# Patient Record
Sex: Female | Born: 1975
Health system: Southern US, Community
[De-identification: ages and names within clinical notes are randomized; demographics above are authoritative.]

---

## 2006-06-21 ENCOUNTER — Ambulatory Visit (HOSPITAL_COMMUNITY): Admission: RE | Admit: 2006-06-21 | Discharge: 2006-06-21 | Payer: Self-pay

## 2006-10-16 ENCOUNTER — Ambulatory Visit (HOSPITAL_COMMUNITY): Admission: RE | Admit: 2006-10-16 | Discharge: 2006-10-16 | Payer: Self-pay

## 2006-11-27 ENCOUNTER — Ambulatory Visit (HOSPITAL_COMMUNITY): Admission: RE | Admit: 2006-11-27 | Discharge: 2006-11-27 | Payer: Self-pay | Admitting: Orthopaedic Surgery

## 2007-10-01 ENCOUNTER — Emergency Department (HOSPITAL_COMMUNITY): Admission: EM | Admit: 2007-10-01 | Discharge: 2007-10-01 | Payer: Self-pay | Admitting: Family Medicine

## 2007-10-31 ENCOUNTER — Encounter (INDEPENDENT_AMBULATORY_CARE_PROVIDER_SITE_OTHER): Payer: Self-pay | Admitting: Internal Medicine

## 2007-10-31 ENCOUNTER — Ambulatory Visit: Payer: Self-pay | Admitting: *Deleted

## 2007-10-31 DIAGNOSIS — L501 Idiopathic urticaria: Secondary | ICD-10-CM | POA: Insufficient documentation

## 2007-10-31 DIAGNOSIS — N750 Cyst of Bartholin's gland: Secondary | ICD-10-CM | POA: Insufficient documentation

## 2008-03-24 ENCOUNTER — Ambulatory Visit: Payer: Self-pay | Admitting: Gynecology

## 2008-09-23 ENCOUNTER — Ambulatory Visit: Payer: Self-pay | Admitting: Obstetrics and Gynecology

## 2008-10-07 ENCOUNTER — Ambulatory Visit: Payer: Self-pay | Admitting: Obstetrics & Gynecology

## 2008-10-28 ENCOUNTER — Ambulatory Visit: Payer: Self-pay | Admitting: Obstetrics and Gynecology

## 2008-10-28 ENCOUNTER — Encounter: Payer: Self-pay | Admitting: Obstetrics and Gynecology

## 2008-11-30 ENCOUNTER — Encounter (INDEPENDENT_AMBULATORY_CARE_PROVIDER_SITE_OTHER): Payer: Self-pay | Admitting: Internal Medicine

## 2008-12-07 ENCOUNTER — Ambulatory Visit (HOSPITAL_COMMUNITY): Admission: RE | Admit: 2008-12-07 | Discharge: 2008-12-07 | Payer: Self-pay | Admitting: Internal Medicine

## 2008-12-07 ENCOUNTER — Ambulatory Visit: Payer: Self-pay | Admitting: Internal Medicine

## 2008-12-07 ENCOUNTER — Encounter (INDEPENDENT_AMBULATORY_CARE_PROVIDER_SITE_OTHER): Payer: Self-pay | Admitting: Internal Medicine

## 2008-12-07 DIAGNOSIS — R42 Dizziness and giddiness: Secondary | ICD-10-CM

## 2008-12-07 LAB — CONVERTED CEMR LAB
Alkaline Phosphatase: 47 units/L (ref 39–117)
BUN: 8 mg/dL (ref 6–23)
Basophils Relative: 0 % (ref 0–1)
Creatinine, Ser: 0.69 mg/dL (ref 0.40–1.20)
Eosinophils Absolute: 0.1 10*3/uL (ref 0.0–0.7)
Glucose, Bld: 76 mg/dL (ref 70–99)
MCHC: 32.2 g/dL (ref 30.0–36.0)
MCV: 91.1 fL (ref 78.0–100.0)
Monocytes Absolute: 0.3 10*3/uL (ref 0.1–1.0)
Monocytes Relative: 6 % (ref 3–12)
Neutrophils Relative %: 62 % (ref 43–77)
RBC: 4.06 M/uL (ref 3.87–5.11)
Sodium: 140 meq/L (ref 135–145)
Total Bilirubin: 0.5 mg/dL (ref 0.3–1.2)
Vitamin B-12: 430 pg/mL (ref 211–911)

## 2008-12-14 ENCOUNTER — Ambulatory Visit: Payer: Self-pay | Admitting: Internal Medicine

## 2008-12-14 DIAGNOSIS — R51 Headache: Secondary | ICD-10-CM

## 2008-12-14 DIAGNOSIS — R519 Headache, unspecified: Secondary | ICD-10-CM | POA: Insufficient documentation

## 2008-12-14 DIAGNOSIS — J329 Chronic sinusitis, unspecified: Secondary | ICD-10-CM | POA: Insufficient documentation

## 2008-12-14 LAB — CONVERTED CEMR LAB: Beta hcg, urine, semiquantitative: NEGATIVE

## 2009-03-15 ENCOUNTER — Inpatient Hospital Stay (HOSPITAL_COMMUNITY): Admission: AD | Admit: 2009-03-15 | Discharge: 2009-03-15 | Payer: Self-pay | Admitting: Obstetrics & Gynecology

## 2009-03-15 ENCOUNTER — Ambulatory Visit: Payer: Self-pay | Admitting: Physician Assistant

## 2009-03-17 ENCOUNTER — Ambulatory Visit: Payer: Self-pay | Admitting: Internal Medicine

## 2009-09-05 ENCOUNTER — Emergency Department (HOSPITAL_COMMUNITY): Admission: EM | Admit: 2009-09-05 | Discharge: 2009-09-05 | Payer: Self-pay | Admitting: Family Medicine

## 2009-12-24 HISTORY — PX: WISDOM TOOTH EXTRACTION: SHX21

## 2010-03-24 HISTORY — PX: BARTHOLIN CYST MARSUPIALIZATION: SHX5383

## 2010-03-29 ENCOUNTER — Ambulatory Visit: Payer: Self-pay | Admitting: Obstetrics & Gynecology

## 2010-03-31 ENCOUNTER — Ambulatory Visit (HOSPITAL_COMMUNITY): Admission: RE | Admit: 2010-03-31 | Discharge: 2010-03-31 | Payer: Self-pay | Admitting: *Deleted

## 2010-03-31 ENCOUNTER — Ambulatory Visit: Payer: Self-pay | Admitting: Obstetrics & Gynecology

## 2010-05-10 ENCOUNTER — Ambulatory Visit: Payer: Self-pay | Admitting: Obstetrics and Gynecology

## 2010-11-14 ENCOUNTER — Ambulatory Visit: Payer: Self-pay | Admitting: Internal Medicine

## 2010-11-14 DIAGNOSIS — L218 Other seborrheic dermatitis: Secondary | ICD-10-CM | POA: Insufficient documentation

## 2010-11-14 DIAGNOSIS — L851 Acquired keratosis [keratoderma] palmaris et plantaris: Secondary | ICD-10-CM

## 2010-11-14 LAB — CONVERTED CEMR LAB
BUN: 11 mg/dL (ref 6–23)
CO2: 26 meq/L (ref 19–32)
Chloride: 103 meq/L (ref 96–112)
Creatinine, Ser: 0.71 mg/dL (ref 0.40–1.20)
HDL: 61 mg/dL (ref >39)
Hemoglobin: 12 g/dL (ref 12.0–15.0)
LDL Cholesterol: 100 mg/dL — ABNORMAL HIGH (ref 0–99)
Lymphocytes Relative: 29 % (ref 12–46)
Monocytes Absolute: 0.5 10*3/uL (ref 0.1–1.0)
Monocytes Relative: 8 % (ref 3–12)
Neutro Abs: 4 10*3/uL (ref 1.7–7.7)
Pap Smear: NEGATIVE
Potassium: 4.5 meq/L (ref 3.5–5.3)
RBC: 4.05 M/uL (ref 3.87–5.11)
VLDL: 12 mg/dL (ref 0–40)
WBC: 6.3 10*3/uL (ref 4.0–10.5)

## 2010-12-06 ENCOUNTER — Emergency Department (HOSPITAL_COMMUNITY)
Admission: EM | Admit: 2010-12-06 | Discharge: 2010-12-06 | Payer: Self-pay | Source: Home / Self Care | Admitting: Family Medicine

## 2010-12-20 ENCOUNTER — Telehealth: Payer: Self-pay | Admitting: Internal Medicine

## 2010-12-20 ENCOUNTER — Emergency Department (HOSPITAL_COMMUNITY)
Admission: EM | Admit: 2010-12-20 | Discharge: 2010-12-20 | Payer: Self-pay | Source: Home / Self Care | Admitting: Emergency Medicine

## 2011-01-23 NOTE — Assessment & Plan Note (Signed)
Summary: SKIN IS PEELING / SB.   Vital Signs:  Patient profile:   Jaime [name] year old Jaime [name] Height:      59.5 inches (151.13 cm) Weight:      118.05 pounds (Jaime [name].66 kg) BMI:     23.Jaime [name] Temp:     97.5 degrees F (36.39 degrees C) oral Pulse rate:   67 / minute BP sitting:   98 / 62  (right arm) Cuff size:   regular  Vitals Entered By: Angelina Ok RN (November 14, 2010 10:59 AM) CC: Depression Is Patient Diabetic? No Pain Assessment Patient in pain? no      Nutritional Status BMI of 19 -24 = normal  Have you ever been in a relationship where you felt threatened, hurt or afraid?No   Does patient need assistance? Functional Status Self care Ambulation Normal Comments Wants a check up. Wants blood work.  Peeling on her feet with burning and itching.  Went to Urgent Care a year ago.   CC:  Depression.  History of Present Illness: Jaime [name] y/o woman with no significant PMH comes to the clinic today after 1 year for the following reasons.  1.She reports that her feet skin has been peeling off for almost 1 year now. This has been associated with itching and sometimes calus formation as well. 2. She reports dryness and itching in her scalp for past few months. 3. She wants to get a pap smear and  routine labs.  Depression History:      The patient denies a depressed mood most of the day and a diminished interest in her usual daily activities.         Preventive Screening-Counseling & Management  Alcohol-Tobacco     Alcohol drinks/day: <1     Smoking Status: never     Passive Smoke Exposure: no  Current Medications (verified): 1)  Carmol 10 % Sham (Urea) .... Apply Twice Daily As Directed 2)  Laclotion 12 % Lotn (Ammonium Lactate) .... Apply Twice Daily As Directed  Allergies (verified): No Known Drug Allergies  Review of Systems      See HPI  The patient denies anorexia, fever, hoarseness, chest pain, syncope, dyspnea on exertion, peripheral edema, prolonged cough, headaches, and  abdominal pain.    Physical Exam  General:  alert, well-developed, and well-nourished.   Head:  dandruff in her hairs, normocephalic and atraumatic.   Mouth:  pharynx pink and moist.   Neck:  supple and full ROM.   Lungs:  normal respiratory effort, no intercostal retractions, no accessory muscle use, normal breath sounds, no dullness, no fremitus, no crackles, and no wheezes.   Heart:  normal rate, regular rhythm, no murmur, no gallop, no rub, and no JVD.   Abdomen:  soft, non-tender, normal bowel sounds, no distention, no masses, no guarding, no rigidity, and no rebound tenderness.   Msk:  normal ROM, no joint tenderness, no joint swelling, no joint warmth, and no redness over joints.   Pulses:  2+pulses b/l. Extremities:  skin peeling off in the vental aspect of her feet,no redness, no blisters, nails are well trimmed and healthy, no cyanosis, clubbing or edema. Neurologic:  alert & oriented X3, cranial nerves II-XII intact, strength normal in all extremities, sensation intact to light touch, gait normal, and DTRs symmetrical and normal.     Impression & Recommendations:  Problem # 1:  DANDRUFF (ICD-690.18) Assessment New She complains of itching and dryness is her scalp. This is most likey related to her dandruff.  Was prescribed anti- dandruff shampoo.  Problem # 2:  XERODERMA (ICD-701.1) She complains of skin peeling off her feet , associated with itching. This is most likey xeoderma. Other DD include athlete's foot, which is less likely as her toe nails appear healthy with no evidence of fungal infection.Marland Kitchen She was adviced to use vaselines, wear socks and closed footwear. She was also prescribed a lubricating lotion. Orders: T-Lipid Profile ([id number])  Problem # 3:  Preventive Health Care (ICD-V70.0) Assessment: Comment Only Her last pap smear was done 2 years ago, so she had a pap smear today. Will ll check CBC, BMET , lipid profile as a part of routine labs.  Complete  Medication List: 1)  Carmol 10 % Sham (Urea) .... Apply twice daily as directed 2)  Laclotion 12 % Lotn (Ammonium lactate) .... Apply twice daily as directed  Other Orders: Pap Smear, Thin Prep (06301) T-Basic Metabolic Panel ([id number]) T-CBC w/Diff ([id number]) T-PAP [facility]) [id number])  Patient Instructions: 1)  Please use the shampoo and lotion as directed. 2)  Please schedule a follow-up appointment in 3 months. Prescriptions: LACLOTION 12 % LOTN (AMMONIUM LACTATE) Apply twice daily as directed  #1 bottle x 0   Entered and Authorized by:   Elyse Jarvis   Signed by:   Elyse Jarvis on 11/14/2010   Method used:   Print then Give to Patient   RxID:   [id number] CARMOL 10 % SHAM (UREA) Apply twice daily as directed  #1 bottle x 3   Entered and Authorized by:   Elyse Jarvis   Signed by:   Elyse Jarvis on 11/14/2010   Method used:   Print then Give to Patient   RxID:   [id number]    Orders Added: 1)  Est. Patient Level IV [09381] 2)  Pap Smear, Thin Prep [88175] 3)  T-Basic Metabolic Panel [[id number]] 4)  T-CBC w/Diff [[id number]] 5)  T-Lipid Profile [[id number]] 6)  T-PAP [facility]) [[id number]]    Prevention & Chronic Care Immunizations   Influenza vaccine: Not documented    Tetanus booster: Not documented    Pneumococcal vaccine: Not documented  Other Screening   Pap smear: NEGATIVE FOR INTRAEPITHELIAL LESIONS OR MALIGNANCY.  (10/28/2008)   Smoking status: never  (11/14/2010)  Process Orders Check Orders Results:     [facility]: ABN not required for this insurance Tests Sent for requisitioning (November 15, 2010 7:39 AM):     11/14/2010: [facility] -- T-Basic Metabolic Panel [phone] (signed)     11/14/2010: [facility] -- T-CBC w/Diff [[id number]] (signed)     11/14/2010: [facility] -- T-Lipid Profile [phone] (signed)

## 2011-01-25 NOTE — Progress Notes (Signed)
  Phone Note Call from Patient   Summary of Call: Patient called re: continued abdominal pain. Treated for UTI at urgent care but urine culture was negative. I reviewed her UA- major finding is hematuria in absence of menstruation-I suspect a kidney stone at this point. We have no available appointment in clinic today. Gladys instructed patient to go to ED for further eval and possible Non-contrasted CT of abdomen to r/o kidney stone. Initial call taken by: Julaine Fusi  DO,  December 20, 2010 10:47 AM

## 2011-03-05 LAB — POCT URINALYSIS DIPSTICK
Bilirubin Urine: NEGATIVE
Glucose, UA: NEGATIVE mg/dL
Nitrite: NEGATIVE
Urobilinogen, UA: 0.2 mg/dL (ref 0.0–1.0)

## 2011-03-05 LAB — URINE CULTURE: Culture  Setup Time: 201112141920

## 2011-03-05 LAB — COMPREHENSIVE METABOLIC PANEL
Alkaline Phosphatase: 50 U/L (ref 39–117)
BUN: 18 mg/dL (ref 6–23)
CO2: 26 mEq/L (ref 19–32)
GFR calc non Af Amer: >60 mL/min (ref >60)
Glucose, Bld: 88 mg/dL (ref 70–99)
Potassium: 3.8 mEq/L (ref 3.5–5.1)
Total Protein: 6.9 g/dL (ref 6.0–8.3)

## 2011-03-05 LAB — URINALYSIS, ROUTINE W REFLEX MICROSCOPIC
Hgb urine dipstick: NEGATIVE
Ketones, ur: NEGATIVE mg/dL
Protein, ur: NEGATIVE mg/dL
Urobilinogen, UA: 0.2 mg/dL (ref 0.0–1.0)

## 2011-03-05 LAB — CBC
HCT: 35.2 % — ABNORMAL LOW (ref 36.0–46.0)
Hemoglobin: 11.9 g/dL — ABNORMAL LOW (ref 12.0–15.0)
MCHC: 33.8 g/dL (ref 30.0–36.0)
MCV: 88 fL (ref 78.0–100.0)
RDW: 12.6 % (ref 11.5–15.5)

## 2011-03-05 LAB — DIFFERENTIAL
Basophils Absolute: 0 10*3/uL (ref 0.0–0.1)
Basophils Relative: 0 % (ref 0–1)
Monocytes Relative: 7 % (ref 3–12)
Neutro Abs: 5.4 10*3/uL (ref 1.7–7.7)
Neutrophils Relative %: 68 % (ref 43–77)

## 2011-03-05 LAB — LIPASE, BLOOD: Lipase: 26 U/L (ref 11–59)

## 2011-03-05 LAB — WET PREP, GENITAL: Yeast Wet Prep HPF POC: NONE SEEN

## 2011-03-05 LAB — GC/CHLAMYDIA PROBE AMP, GENITAL: GC Probe Amp, Genital: NEGATIVE

## 2011-03-14 LAB — CBC
HCT: 35.6 % — ABNORMAL LOW (ref 36.0–46.0)
Hemoglobin: 12.4 g/dL (ref 12.0–15.0)
MCHC: 34.8 g/dL (ref 30.0–36.0)
MCV: 90.6 fL (ref 78.0–100.0)
RBC: 3.93 MIL/uL (ref 3.87–5.11)
RDW: 13.1 % (ref 11.5–15.5)

## 2011-03-14 LAB — ANAEROBIC CULTURE

## 2011-03-14 LAB — WOUND CULTURE: Culture: NO GROWTH

## 2011-05-08 NOTE — Group Therapy Note (Signed)
NAME:  Jaime [name], Jaime [name] NO.:  [id number]   MEDICAL RECORD NO.:  [id number]          PATIENT TYPE:  WOC   LOCATION[facility]  [facility]                   FACILITY:  [facility]   PHYSICIAN:  Argentina Donovan, MD        DATE OF BIRTH:  [birth year]   DATE OF SERVICE:  10/28/2008                                  CLINIC NOTE   The patient is a 35 year old Hispanic English speaking female who had a  Word catheter placed on October 1 for recurrent Bartholin gland cyst  abscess.  She did very well.  We removed it today and it is well  granulated around the opening after having been in for almost 5 weeks.  In addition to this, the patient needed an annual exam.   EXAM:  External genitalia normal with the exception of a small opening  into the Bartholin gland on the left side.  Introitus is marital.  BUS  within normal limits.  Vagina is clean and well rugated.  Cervix is  clean and parous and Pap smear was done.  Uterus is anterior normal  size, shape, consistency.  Adnexa were normal.   IMPRESSION:  Normal gynecological examination.           ______________________________  Argentina Donovan, MD     PR/MEDQ  D:  10/28/2008  T:  10/28/2008  Job:  [id number]

## 2011-05-08 NOTE — Group Therapy Note (Signed)
NAME:  Jaime [name], Jaime [name] NO.:  [id number]   MEDICAL RECORD NO.:  [id number]          PATIENT TYPE:  WOC   LOCATION[facility]                   FACILITY:  [facility]   PHYSICIAN:  Ginger Carne, MD DATE OF BIRTH:  08-02-1976   DATE OF SERVICE:                                  CLINIC NOTE   Patient is seen today in the GYN clinic at [facility] on March 24, 2008.   CHIEF COMPLAINT:  Bartholin's gland cyst.   She is here today as a referral by [facility] outpatient [facility].  She  first had complaints of Bartholin's gland cyst in November, 2008 and was  evaluated by the family practice center, at which time it was unable to  be drained, and she was referred to [place].   PAST MEDICAL HISTORY:  She has no current allergies.  No current  medications.  Her immunizations are up to date.   MENSTRUAL HISTORY:  Menarche began at age 60.  The first day of her last  menstrual period was February 27, 2008.  Menstrual cycles, she reports, are  regular.  She has 23 days in between them.  Her periods last  approximately 6-7 days.   CONTRACEPTIVE HISTORY:  Positive for a Mirena IUD that was placed in  October, 2006.   OB HISTORY:  She has been pregnant twice.  She has two living children.  She had one vaginal delivery and one cesarean section.  The date of her  last Pap smear is in November, 2008.  She has no history of abnormal Pap  smears.   SURGICAL HISTORY:  Positive for right Bartholin's gland removal in  January, 2007 at [facility] in [place].   She has no significant family history.   PERSONAL MEDICAL HISTORY:  Only positive for a history of right-sided  Bartholin's gland cyst that resolved after removal of gland.   SOCIAL HISTORY:  She is married.  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  She lives with her son, daughter, and husband.  She does  not smoke, she does not drink.  She does occasionally drink caffeinated  beverages.   REVIEW OF SYSTEMS:  Only positive as indicated in the HPI.   PHYSICAL EXAMINATION:  Temperature 98.1, pulse 63, blood pressure  101/67, weight 119.3.  Her height is 4 feet 11.  Respiratory rate was  20.  Ms. Deshmukh is a pleasant, appropriate 35 year old female who appears to  be her stated age and is in no apparent distress.  Her exam today is  very problem-focused.  On exam of her external genitalia, she has a Tanner 5 with a shaved  perineum.  She does have an enlarged Bartholin's gland cyst at the left  introitus, which measured approximately 2 cm depth x 2 cm width.  It is  firm, nontender.  The skin is nonerythematous.  It is nonfluctuant and  is unable to be drained today.   ASSESSMENT:  Left Bartholin's gland cyst.   PLAN:  1. Keflex 500 mg q.6h. x10 days.  Dispense 40 with no refills.  2. Warm baking soda soaks  to help bring infection to surface.  3. Patient is discharged to return in 3-4 weeks to re-evaluate her      progress.  However, in the need of emergency increase in pain, she      is instructed to go to maternity admissions unit for evaluation for      I&D.   Dr. Mia Creek was consulted with the presentation of this patient and  states that surgery is not currently an option, given the increased risk  for bleeding and increased risk of blood loss with removal of the left  gland for a patient under the age of 87.  Patient verbalizes  understanding of the plan and is in agreement.     ______________________________  Maylon Cos, CNM    ______________________________  Ginger Carne, MD    SS/MEDQ  D:  03/24/2008  T:  03/24/2008  Job:  [id number]

## 2011-05-08 NOTE — Group Therapy Note (Signed)
NAME:  Jaime [name], HOLE NO.:  [id number]   MEDICAL RECORD NO.:  [id number]          PATIENT TYPE:  WOC   [facility]ATION[facility]                   [facility]ILITY[facility]   PHYSICIAN:  Argentina Donovan, MD        DATE OF BIRTH:  1976/02/11   DATE OF SERVICE:  09/23/2008                                  CLINIC NOTE   The patient is a 35 year old Caucasian female who has had a Bartholin  cyst that bothered her, and they attempted conservatively to resolve the  cyst over a period of time of several months.  It has continued  bothering her and measured approximately 4 x 3 x 3 cm on the left side.  The right Bartholin gland was removed by a surgeon in [place] she  stated, and she had a terrible time healing and she received several  units of blood.  We told her that the best way to treat this is with a  Word catheter.  She agreed to that.  We discussed the procedure with her  and explained the procedure.  It was not possible to reach around behind  the hymen, so just in front of the hymen we injected 3 mL of 1%  Xylocaine from the bulb and numbness occurred. With a #10 blade we made  an opening into the gland.  Purulent material extruded from the gland.  The Word catheter was placed, and 3 mL of normal saline was injected  into the Word catheter.  Instructions have been given to the patient on  how to remove it by cutting the end off if it became intolerable, but  she was encouraged to keep it from anywhere from 3-6 weeks in there.  We  are going to bring her back in a couple of weeks and see how she has  done, but I think she will get immediate relief from the drainage and I  have told her to take some sitz baths.  I am going to give her a  prescription for Percocet.   DIAGNOSIS:  Bartholin gland left abscess, incision and drainage, and  insertion of Word catheter.           ______________________________  Argentina Donovan, MD     PR/MEDQ  D:  09/23/2008  T:  09/24/2008  Job:   [id number]

## 2011-08-23 ENCOUNTER — Ambulatory Visit: Payer: Self-pay | Admitting: Physician Assistant

## 2011-08-23 ENCOUNTER — Ambulatory Visit: Payer: Self-pay | Admitting: Obstetrics & Gynecology

## 2011-12-05 ENCOUNTER — Ambulatory Visit (INDEPENDENT_AMBULATORY_CARE_PROVIDER_SITE_OTHER): Payer: Self-pay | Admitting: Advanced Practice Midwife

## 2011-12-05 ENCOUNTER — Encounter: Payer: Self-pay | Admitting: *Deleted

## 2011-12-05 DIAGNOSIS — Z30430 Encounter for insertion of intrauterine contraceptive device: Secondary | ICD-10-CM

## 2011-12-05 DIAGNOSIS — Z30432 Encounter for removal of intrauterine contraceptive device: Secondary | ICD-10-CM

## 2011-12-05 DIAGNOSIS — Z30433 Encounter for removal and reinsertion of intrauterine contraceptive device: Secondary | ICD-10-CM

## 2011-12-05 MED ORDER — IBUPROFEN 200 MG PO TABS: 600.0000 mg | ORAL_TABLET | Freq: Once | ORAL | Status: DC

## 2011-12-05 MED ORDER — LEVONORGESTREL 20 MCG/24HR IU IUD: 1.0000 | INTRAUTERINE_SYSTEM | Freq: Once | INTRAUTERINE | Status: DC

## 2011-12-05 NOTE — Progress Notes (Signed)
  Subjective:    Patient ID: Jaime [name], female    DOB: [birth year], 35 y.o.   MRN: [id number]  HPI: Pt is here for removal and insertion of Mirena IUD.  Review of Systems: Neg     Objective:   Physical Exam: Anteverted uterus. Normal size. NT. Strings visualized.  Strings grasped w/ ring forceps. Removed w/out difficulty.  Patient identified, informed consent performed, signed copy in chart, time out was performed.  Urine pregnancy test negative.  Speculum placed in the vagina.  Cervix visualized.  Cleaned with Betadine x 2.  Grasped anteriourly with a single tooth tenaculum.  Uterus sounded to 7.  Mirena IUD placed per manufacturer's recommendations.  Strings trimmed to 3 cm.   Patient given post procedure instructions and Mirena care card with expiration date.  Patient is asked to check IUD strings periodically and follow up in 4-6 weeks for IUD check.     Assessment & Plan:  F/U in 6 weeks  Dorathy Kinsman 12/05/2011 5:12 PM

## 2011-12-05 NOTE — Patient Instructions (Signed)
Intrauterine Device Insertion Care After Refer to this sheet in the next few weeks. These instructions provide you with information on caring for yourself after your procedure. Your caregiver may also give you more specific instructions. Your treatment has been planned according to current medical practices, but problems sometimes occur. Call your caregiver if you have any problems or questions after your procedure. HOME CARE INSTRUCTIONS   Only take over-the-counter or prescription medicines for pain, discomfort, or fever as directed by your caregiver. Do not use aspirin. This may increase bleeding.   Check your IUD to make sure it is in place before you resume sexual activity. You should be able to feel the strings. If you cannot feel the strings, something may be wrong. The IUD may have fallen out of the uterus, or the uterus may have been punctured (perforated) during placement. Also, if the strings are getting longer, it may mean that the IUD is being forced out of the uterus. You no longer have full protection from pregnancy if any of these problems occur.   You may resume sexual intercourse if you are not having problems with the IUD. The IUD is considered immediately effective.   You may resume normal activities.   Keep all follow-up appointments to be sure your IUD has remained in place. After the first exam, yearly exams are advised, unless you cannot feel the strings of your IUD.   Continue to check that the IUD is still in place by feeling for the strings after every menstrual period.  SEEK MEDICAL CARE IF:   You have bleeding that is heavier or lasts longer than a normal menstrual cycle.   You have a fever.   You have increasing cramps or abdominal pain not relieved with medicine.   You have abdominal pain that does not seem to be related to the same area of earlier cramping and pain.   You are lightheaded, unusually weak, or faint.   You have abnormal vaginal discharge or  smells.   You have pain during sexual intercourse.   You cannot feel the IUD strings, or the IUD string has gotten longer.   You feel the IUD at the opening of the cervix in the vagina.   You think you are pregnant, or you miss your menstrual period.   The IUD string is hurting your sex partner.  Document Released: 08/08/2011 Document Revised: 08/22/2011 Document Reviewed: 08/08/2011 [employer] Patient Information 2012 [employer]. 

## 2012-06-30 ENCOUNTER — Ambulatory Visit (INDEPENDENT_AMBULATORY_CARE_PROVIDER_SITE_OTHER): Payer: 59 | Admitting: Obstetrics & Gynecology

## 2012-06-30 ENCOUNTER — Encounter: Payer: Self-pay | Admitting: Obstetrics & Gynecology

## 2012-06-30 VITALS — BP 104/70 | HR 50 | Temp 98.0°F | Ht 60.0 in | Wt 108.8 lb

## 2012-06-30 DIAGNOSIS — Z30431 Encounter for routine checking of intrauterine contraceptive device: Secondary | ICD-10-CM

## 2012-06-30 NOTE — Progress Notes (Signed)
History:  36 y.o. G2P0202 here today for discussion about Mirena IUD symptoms.  Had it placed about 2 months ago. Since then, she has a lot of pelvic pain and dyspareunia, also reports her husband feels the strings.  She wants to remove the IUD.  Denies any abnormal bleeding, discharge or any other gynecologic concerns.  The following portions of the patient's history were reviewed and updated as appropriate: allergies, current medications, past family history, past medical history, past social history, past surgical history and problem list.  Review of Systems:  Pertinent items are noted in HPI.  Objective:  Physical Exam Blood pressure 104/70, pulse 50, temperature 98 F (36.7 C), temperature source Oral, height 5' (1.524 m), weight 108 lb 12.8 oz (49.351 kg). Gen: NAD Abd: Soft, mild suprapubic tenderness and nondistended Pelvic: Normal appearing external genitalia; normal appearing vaginal mucosa and cervix . ID strings visualized, trimmed to level of external os.  Normal discharge.  Small uterus, no other palpable masses,  mild uterine tenderness  Assessment & Plan:  Emphasized that it is normal to have irregular bleeding and cramping for the first 3-6 months after Mirena IUD placement.  Patient unsure about wanting the IUD removed; discussed alternate contraceptive methods in detail.  Interested in Essure.  Risks/benefits discussed, handout given to patient, emphasized need for follow up HSG and backup contraception during the three months prior to the HSG.  Also discussed other reversible contraception, laparoscopic BTS and vasectomy as alternatives.  She will discuss this with her husband and let us know her decision.   In the meantime, will get a pelvic ultrasound to evaluate for IUD position, also for other anomalies that could cause pain.  She was advised to return to clinic for any scheduled appointments or for any gynecologic concerns as needed.

## 2012-06-30 NOTE — Patient Instructions (Addendum)
It is normal to have irregular bleeding and cramping for the first 3-6 months after Mirena IUD placement!!!   Transcervical Hysteroscopic Sterilization Transcervical hysteroscopic sterilizationis a procedure performed to permanently prevent pregnancy. Transcervical means the procedure is done though the cervix, so no cut (incision) is needed. Tiny coils (microinserts) are placed in the fallopian tubes. After the microinserts are placed, scar tissue forms in the fallopian tubes. The scar tissue will not allow an egg to reach the uterus. If an egg cannot reach the uterus, sperm cannot fertilize it.  You must be very sure you do not want to get pregnant. Deciding to have a permanent sterilization is a big decision. Take your time and do not decide under stress. Women who make this decision before age 40 also tend to have later regrets. Talk about the procedure with your partner. Things to know:  This procedure is not considered effective birth control for at least 3 months.   You will need an additional procedure (hysterosalpingiogram, HSG) to confirm the tubes are blocked.   You will need to use another form of birth control for at least 3 months.   If your tubes are not blocked after 3 months, you will need to talk with your caregiver about options.  LET YOUR CAREGIVER KNOW ABOUT:  Gynecological history, including previous gynecological surgeries or procedures, recent pregnancies, and previous birth control use, including pills and intrauterine devices (IUDs).   Allergies. This includes any problems or reactions to metals.   All medicines you are taking including vitamins, herbs, over-the-counter medicines, and creams.   Use of steroids (by mouth or as creams).   Previous problems with numbing medicines.   Previous bleeding problems.   Previous surgeries.   Other health problems, including diabetes and kidney problems.   Possibility of pregnancy.   Recent pelvic infections.    RISKS AND COMPLICATIONS  A hole (perforation) in the uterus or fallopian tube.   Allergic reaction to the coils used to block the fallopian tubes.   The coil falls out (extrusion).   Infection.   Bleeding.   Chronic or acute pelvicpain.   Painful menstrual periods.   A pregnancy that grows inside a fallopian tube instead of the uterus (ectopic pregnancy).   One or both fallopian tubes are not fully blocked.  BEFORE THE PROCEDURE  You may need to have a pregnancy test.   Ask your caregiver about changing or stopping your regular medicines.   You may need to keep track of your menstrual cycle. This procedure works best when it is done about 7 days after your period starts.   Talk to your caregiver about birth control.   If you are not using birth control, you may need to start 2 to 3 weeks before the procedure. This makes the procedure easier and ensures that you are not pregnant.  PROCEDURE This procedure takes about 30 minutes in the operating room.  You will be given general anesthesia  You may be given a medicine to numb your cervix (local anesthetic).   A long, thin telescope with a camera (hysteroscope) will be put into your vagina, then through your cervix, and into the uterus.   The openings to both fallopian tubes are seen with the hysteroscope.   Through the hysteroscope, microinserts are put into the fallopian tube openings. They unwind once they are in place. They do not block the openings to the fallopian tubes, but over time, the microinserts will scar the tubes shut.  AFTER  THE PROCEDURE  You may be able to go home after surgery.   You may have mild cramps.   You may have some mild bleeding or discharge from your vagina for a few days.   In 3 months, you will need to have an X-ray taken. This test is done to make sure your fallopian tubes are completely blocked.  Document Released: 08/28/2011 Document Revised: 11/29/2011 Document Reviewed:  08/28/2011 [facility] Patient Information 2012 [place], [place].

## 2012-07-03 ENCOUNTER — Ambulatory Visit (HOSPITAL_COMMUNITY)
Admission: RE | Admit: 2012-07-03 | Discharge: 2012-07-03 | Disposition: A | Discharge status: Home / Self Care | Payer: 59 | Source: Ambulatory Visit | Attending: Obstetrics & Gynecology | Admitting: Obstetrics & Gynecology

## 2012-07-03 DIAGNOSIS — Z30431 Encounter for routine checking of intrauterine contraceptive device: Secondary | ICD-10-CM | POA: Insufficient documentation

## 2012-07-03 DIAGNOSIS — IMO0002 Reserved for concepts with insufficient information to code with codable children: Secondary | ICD-10-CM | POA: Insufficient documentation

## 2012-07-03 DIAGNOSIS — D251 Intramural leiomyoma of uterus: Secondary | ICD-10-CM | POA: Insufficient documentation

## 2012-07-03 DIAGNOSIS — N949 Unspecified condition associated with female genital organs and menstrual cycle: Secondary | ICD-10-CM | POA: Insufficient documentation

## 2012-07-07 ENCOUNTER — Telehealth: Payer: Self-pay

## 2012-07-07 NOTE — Telephone Encounter (Signed)
Message copied by Faythe Casa on Mon Jul 07, 2012 12:00 PM ------      Message from: Jaynie Collins A      Created: Thu Jul 03, 2012  4:32 PM       Please inform patient that there is normal IUD placement within uterus, no other etiologies for her pain.  Cramping is a known side effect in the first 3-6 months after Mirena placement. Patient should call back with any further concerns.

## 2012-07-07 NOTE — Telephone Encounter (Signed)
Called pt and informed pt what is stated below by Dr. Macon Large.  Pt stated "great and thank you".  Pt did not have any further questions.

## 2014-03-16 ENCOUNTER — Inpatient Hospital Stay (HOSPITAL_COMMUNITY): Payer: 59

## 2014-03-16 ENCOUNTER — Other Ambulatory Visit: Payer: Self-pay

## 2014-03-16 ENCOUNTER — Encounter (HOSPITAL_COMMUNITY): Payer: Self-pay | Admitting: General Practice

## 2014-03-16 ENCOUNTER — Inpatient Hospital Stay (HOSPITAL_COMMUNITY)
Admission: AD | Admit: 2014-03-16 | Discharge: 2014-03-16 | Disposition: A | Discharge status: Home / Self Care | Payer: 59 | Source: Ambulatory Visit | Attending: Emergency Medicine | Admitting: Emergency Medicine

## 2014-03-16 DIAGNOSIS — R0789 Other chest pain: Secondary | ICD-10-CM

## 2014-03-16 DIAGNOSIS — R0602 Shortness of breath: Secondary | ICD-10-CM | POA: Insufficient documentation

## 2014-03-16 DIAGNOSIS — R42 Dizziness and giddiness: Secondary | ICD-10-CM | POA: Insufficient documentation

## 2014-03-16 DIAGNOSIS — G43909 Migraine, unspecified, not intractable, without status migrainosus: Secondary | ICD-10-CM | POA: Insufficient documentation

## 2014-03-16 DIAGNOSIS — I498 Other specified cardiac arrhythmias: Secondary | ICD-10-CM | POA: Insufficient documentation

## 2014-03-16 LAB — TROPONIN I: Troponin I: <0.3 ng/mL (ref <0.30)

## 2014-03-16 LAB — RAPID URINE DRUG SCREEN, HOSP PERFORMED
AMPHETAMINES: NOT DETECTED
BENZODIAZEPINES: NOT DETECTED
Barbiturates: NOT DETECTED
Cocaine: NOT DETECTED
OPIATES: NOT DETECTED
Tetrahydrocannabinol: NOT DETECTED

## 2014-03-16 LAB — CBC
HEMATOCRIT: 36.9 % (ref 36.0–46.0)
HEMOGLOBIN: 12.7 g/dL (ref 12.0–15.0)
MCH: 31.1 pg (ref 26.0–34.0)
MCHC: 34.4 g/dL (ref 30.0–36.0)
MCV: 90.4 fL (ref 78.0–100.0)
Platelets: 234 10*3/uL (ref 150–400)
RBC: 4.08 MIL/uL (ref 3.87–5.11)
RDW: 12.8 % (ref 11.5–15.5)
WBC: 7.3 10*3/uL (ref 4.0–10.5)

## 2014-03-16 LAB — URINALYSIS, ROUTINE W REFLEX MICROSCOPIC
Bilirubin Urine: NEGATIVE
GLUCOSE, UA: NEGATIVE mg/dL
Ketones, ur: NEGATIVE mg/dL
Leukocytes, UA: NEGATIVE
Nitrite: NEGATIVE
PROTEIN: NEGATIVE mg/dL
Specific Gravity, Urine: <1.005 — ABNORMAL LOW (ref 1.005–1.030)
Urobilinogen, UA: 0.2 mg/dL (ref 0.0–1.0)
pH: 6 (ref 5.0–8.0)

## 2014-03-16 LAB — COMPREHENSIVE METABOLIC PANEL
ALT: 24 U/L (ref 0–35)
AST: 20 U/L (ref 0–37)
Albumin: 4 g/dL (ref 3.5–5.2)
Alkaline Phosphatase: 80 U/L (ref 39–117)
BUN: 20 mg/dL (ref 6–23)
CHLORIDE: 100 meq/L (ref 96–112)
CO2: 23 meq/L (ref 19–32)
Calcium: 9.2 mg/dL (ref 8.4–10.5)
Creatinine, Ser: 0.67 mg/dL (ref 0.50–1.10)
Glucose, Bld: 91 mg/dL (ref 70–99)
Potassium: 4 mEq/L (ref 3.7–5.3)
SODIUM: 136 meq/L — AB (ref 137–147)
Total Protein: 7 g/dL (ref 6.0–8.3)

## 2014-03-16 LAB — POCT PREGNANCY, URINE: PREG TEST UR: NEGATIVE

## 2014-03-16 LAB — URINE MICROSCOPIC-ADD ON

## 2014-03-16 LAB — D-DIMER, QUANTITATIVE: D-Dimer, Quant: 0.35 ug/mL-FEU (ref 0.00–0.48)

## 2014-03-16 LAB — GLUCOSE, CAPILLARY: Glucose-Capillary: 84 mg/dL (ref 70–99)

## 2014-03-16 MED ORDER — GI COCKTAIL ~~LOC~~
30.0000 mL | Freq: Once | ORAL | Status: AC
  Administered 2014-03-16: 30 mL via ORAL
  Filled 2014-03-16: qty 30

## 2014-03-16 MED ORDER — KETOROLAC TROMETHAMINE 60 MG/2ML IM SOLN
60.0000 mg | Freq: Once | INTRAMUSCULAR | Status: AC
  Administered 2014-03-16: 60 mg via INTRAMUSCULAR
  Filled 2014-03-16: qty 2

## 2014-03-16 MED ORDER — DIPHENHYDRAMINE HCL 50 MG/ML IJ SOLN
12.5000 mg | Freq: Once | INTRAMUSCULAR | Status: AC
  Administered 2014-03-16: 12.5 mg via INTRAVENOUS
  Filled 2014-03-16: qty 1

## 2014-03-16 MED ORDER — METOCLOPRAMIDE HCL 5 MG/ML IJ SOLN
10.0000 mg | Freq: Once | INTRAMUSCULAR | Status: AC
  Administered 2014-03-16: 10 mg via INTRAVENOUS
  Filled 2014-03-16: qty 2

## 2014-03-16 MED ORDER — ASPIRIN 325 MG PO TABS
325.0000 mg | ORAL_TABLET | Freq: Once | ORAL | Status: AC
  Administered 2014-03-16: 325 mg via ORAL
  Filled 2014-03-16: qty 1

## 2014-03-16 MED ORDER — SODIUM CHLORIDE 0.9 % IV BOLUS (SEPSIS)
1000.0000 mL | Freq: Once | INTRAVENOUS | Status: AC
  Administered 2014-03-16: 1000 mL via INTRAVENOUS

## 2014-03-16 NOTE — ED Provider Notes (Signed)
CSN: [id number]     Arrival date & time 03/16/14  1050 History   First MD Initiated Contact with Patient 03/16/14 1531     Chief Complaint  Patient presents with  . Chest Pain  . Shortness of Breath  . light headed      (Consider location/radiation/quality/duration/timing/severity/associated sxs/prior Treatment) The history is provided by the patient. No language interpreter was used.  Jaime [name] is a 38 y/o F, G2P0202, with no known significant PMHX presenting to the ED with chest pain, shortness of breath, light headedness and headache. Patient was seen and assessed at [facility] and was trasnferred to the ED for further assessment. Patient reported that she has been having chest pain for the past 4 weeks intermittently lasting anywhere to a couple of minutes to a couple of hours. Patient reported that the chest pain is localized to the center of the chest described as a pressure, someone pushing on her chest. Reported that she has been taking a Bayer ASA as needed for the pain with mild relief. Patient reported that the chest pain has been constant starting last night to this morning where she was then seen at 10:30AM this morning at [facility]. Patient reported that with the chest pain she has been having shortness of breath. Reported no trend with the chest pain - that it is sporadic. Patient reported that she has been having headaches localized to the temporal regions described as a pulsating sensation that worsens with light and loud sounds. Patient reported that these headaches are sporadic and that they have been ongoing for the past 2-3 months. Stated that with these headaches she gets numbness to her cheeks and pin and needles sensation with mild warmness sensation. Reported that she does get blurred vision bilaterally with the headaches. Stated that she has been using ASA with relief. Stated that she is currently on Mirena - has been on it for a year. Denies smoking cigarettes, recreational drug use.  Denied hypertension diabetes history. Denied nausea, vomiting, diarrhea, history of migraines, abdominal pain, diarrhea, melena, Tesio, cough, nasal congestion, fever, hemoptysis. PCP none  History reviewed. No pertinent past medical history. Past Surgical History  Procedure Laterality Date  . Bartholin cyst marsupialization  03/2010  . Wisdom tooth extraction  2011   History reviewed. No pertinent family history. History  Substance Use Topics  . Smoking status: Never Smoker   . Smokeless tobacco: Not on file  . Alcohol Use: No   OB History   Grav Para Term Preterm Abortions TAB SAB Ect Mult Living   2 2  2      2      Review of Systems  Constitutional: Negative for fever and chills.  HENT: Negative for trouble swallowing.   Respiratory: Positive for shortness of breath. Negative for cough.   Cardiovascular: Positive for chest pain. Negative for leg swelling.  Gastrointestinal: Negative for nausea, vomiting, abdominal pain, diarrhea, constipation and blood in stool.  Musculoskeletal: Negative for back pain and neck pain.  Neurological: Positive for light-headedness and headaches. Negative for dizziness, syncope and weakness.  All other systems reviewed and are negative.      Allergies  Review of patient's allergies indicates no known allergies.  Home Medications   Current Outpatient Rx  Name  Route  Sig  Dispense  Refill  . Cholecalciferol (VITAMIN D) 2000 UNITS tablet   Oral   Take 2,000 Units by mouth daily.         . Cinnamon 500 MG TABS  Oral   Take 1 tablet by mouth daily.         . ferrous sulfate dried (SLOW FE) 160 (50 FE) MG TBCR SR tablet   Oral   Take 320 mg by mouth daily.         . Melatonin 1 MG TABS   Oral   Take 1 tablet by mouth daily as needed (sleep).         . vitamin B-12 (CYANOCOBALAMIN) 100 MCG tablet   Oral   Take 100 mcg by mouth daily.         . fish oil-omega-3 fatty acids 1000 MG capsule   Oral   Take 2 g by mouth  daily.           . Multiple Vitamin (MULTIVITAMIN) capsule   Oral   Take 1 capsule by mouth daily.            BP 116/73  Pulse 69  Temp(Src) 98.1 F (36.7 C) (Oral)  Resp 16  Ht 4\' 11"  (1.499 m)  Wt 112 lb (50.803 kg)  BMI 22.61 kg/m2  SpO2 99% Physical Exam  Nursing note and vitals reviewed. Constitutional: She is oriented to person, place, and time. She appears well-developed and well-nourished. No distress.  HENT:  Head: Normocephalic and atraumatic.  Mouth/Throat: Oropharynx is clear and moist. No oropharyngeal exudate.  Eyes: Conjunctivae and EOM are normal. Pupils are equal, round, and reactive to light. Right eye exhibits no discharge. Left eye exhibits no discharge.  Mild horizontal nystagmus with range of motion to eyes Visual fields grossly intact  Neck: Normal range of motion. Neck supple. No tracheal deviation present.  Negative neck stiffness Negative nuchal rigidity Negative cervical lymphadenopathy Negative meningeal signs  Cardiovascular: Normal rate, regular rhythm and normal heart sounds.  Exam reveals no friction rub.   No murmur heard. Pulses:      Radial pulses are 2+ on the right side, and 2+ on the left side.       Dorsalis pedis pulses are 2+ on the right side, and 2+ on the left side.  Cap refill less than 3 seconds Negative swelling or pitting edema localized to lower extremities bilaterally  Pulmonary/Chest: Effort normal and breath sounds normal. No respiratory distress. She has no wheezes. She has no rales. She exhibits no tenderness.  Patient is able to speak in full sentences without difficulty Negative stridor Negative use of accessory muscles Negative signs of ecchymosis to the chest wall Negative pain upon palpation to the chest wall-negative crepitus  Musculoskeletal: Normal range of motion.  Full ROM to upper and lower extremities without difficulty noted, negative ataxia noted.  Lymphadenopathy:    She has no cervical adenopathy.   Neurological: She is alert and oriented to person, place, and time. No cranial nerve deficit. She exhibits normal muscle tone. Coordination normal.  Cranial nerves III-XII grossly intact Strength 5+/5+ to upper and lower extremities bilaterally with resistance applied, equal distribution noted Sensation intact Negative arm drift Fine motor skills intact Heel to knee down shin normal bilaterally Patient is able to bring finger to nose without difficulty or ataxia Mild sway with Romberg Patient is able to onto the toes and heel to toe without difficulty Gait proper, proper balance - negative sway, negative drift, negative step-offs  Skin: Skin is warm and dry. No rash noted. She is not diaphoretic. No erythema.  Psychiatric: She has a normal mood and affect. Her behavior is normal. Thought content normal.  ED Course  Procedures (including critical care time)  Patient seen and assessed by attending physician who did not recommended imaging regarding headache. Recommended patient to be discharge - cleared patient for discharge.   Results for orders placed during the hospital encounter of 03/16/14  URINE RAPID DRUG SCREEN (HOSP PERFORMED)      Result Value Ref Range   Opiates NONE DETECTED  NONE DETECTED   Cocaine NONE DETECTED  NONE DETECTED   Benzodiazepines NONE DETECTED  NONE DETECTED   Amphetamines NONE DETECTED  NONE DETECTED   Tetrahydrocannabinol NONE DETECTED  NONE DETECTED   Barbiturates NONE DETECTED  NONE DETECTED  URINALYSIS, ROUTINE W REFLEX MICROSCOPIC      Result Value Ref Range   Color, Urine YELLOW  YELLOW   APPearance CLEAR  CLEAR   Specific Gravity, Urine <1.005 (*) 1.005 - 1.030   pH 6.0  5.0 - 8.0   Glucose, UA NEGATIVE  NEGATIVE mg/dL   Hgb urine dipstick SMALL (*) NEGATIVE   Bilirubin Urine NEGATIVE  NEGATIVE   Ketones, ur NEGATIVE  NEGATIVE mg/dL   Protein, ur NEGATIVE  NEGATIVE mg/dL   Urobilinogen, UA 0.2  0.0 - 1.0 mg/dL   Nitrite NEGATIVE  NEGATIVE    Leukocytes, UA NEGATIVE  NEGATIVE  CBC      Result Value Ref Range   WBC 7.3  4.0 - 10.5 K/uL   RBC 4.08  3.87 - 5.11 MIL/uL   Hemoglobin 12.7  12.0 - 15.0 g/dL   HCT 36.9  36.0 - 46.0 %   MCV 90.4  78.0 - 100.0 fL   MCH 31.1  26.0 - 34.0 pg   MCHC 34.4  30.0 - 36.0 g/dL   RDW 12.8  11.5 - 15.5 %   Platelets 234  150 - 400 K/uL  COMPREHENSIVE METABOLIC PANEL      Result Value Ref Range   Sodium 136 (*) 137 - 147 mEq/L   Potassium 4.0  3.7 - 5.3 mEq/L   Chloride 100  96 - 112 mEq/L   CO2 23  19 - 32 mEq/L   Glucose, Bld 91  70 - 99 mg/dL   BUN 20  6 - 23 mg/dL   Creatinine, Ser 0.67  0.50 - 1.10 mg/dL   Calcium 9.2  8.4 - 10.5 mg/dL   Total Protein 7.0  6.0 - 8.3 g/dL   Albumin 4.0  3.5 - 5.2 g/dL   AST 20  0 - 37 U/L   ALT 24  0 - 35 U/L   Alkaline Phosphatase 80  39 - 117 U/L   Total Bilirubin <0.2 (*) 0.3 - 1.2 mg/dL   GFR calc non Af Amer >90  >90 mL/min   GFR calc Af Amer >90  >90 mL/min  URINE MICROSCOPIC-ADD ON      Result Value Ref Range   Squamous Epithelial / LPF RARE  RARE   WBC, UA 0-2  <3 WBC/hpf   RBC / HPF 0-2  <3 RBC/hpf  GLUCOSE, CAPILLARY      Result Value Ref Range   Glucose-Capillary 84  70 - 99 mg/dL  D-DIMER, QUANTITATIVE      Result Value Ref Range   D-Dimer, Quant 0.35  0.00 - 0.48 ug/mL-FEU  TROPONIN I      Result Value Ref Range   Troponin I <0.30  <0.30 ng/mL  POCT PREGNANCY, URINE      Result Value Ref Range   Preg Test, Ur NEGATIVE  NEGATIVE  Labs Review Labs Reviewed  URINALYSIS, ROUTINE W REFLEX MICROSCOPIC - Abnormal; Notable for the following:    Specific Gravity, Urine <1.005 (*)    Hgb urine dipstick SMALL (*)    All other components within normal limits  COMPREHENSIVE METABOLIC PANEL - Abnormal; Notable for the following:    Sodium 136 (*)    Total Bilirubin <0.2 (*)    All other components within normal limits  URINE RAPID DRUG SCREEN (HOSP PERFORMED)  CBC  URINE MICROSCOPIC-ADD ON  GLUCOSE, CAPILLARY  D-DIMER,  QUANTITATIVE  TROPONIN I  POCT PREGNANCY, URINE   Imaging Review Dg Chest 2 View  03/16/2014   CLINICAL DATA:  Chest pain  EXAM: CHEST  2 VIEW  COMPARISON:  None.  FINDINGS: The heart size and mediastinal contours are within normal limits. Both lungs are clear. The visualized skeletal structures are unremarkable.  IMPRESSION: No active cardiopulmonary disease.   Electronically Signed   By: Daryll Brod M.D.   On: 03/16/2014 16:43     EKG Interpretation   Date/Time:  Tuesday March 16 2014 15:41:44 EDT Ventricular Rate:  71 PR Interval:  130 QRS Duration: 80 QT Interval:  412 QTC Calculation: 448 R Axis:   40 Text Interpretation:  Age not entered, assumed to be  38 years old for  purpose of ECG interpretation Sinus rhythm Baseline wander in lead(s) V5  Confirmed by ZAVITZ  MD, JOSHUA ([id number]) on 03/16/2014 5:04:49 PM      MDM   Final diagnoses:  Atypical chest pain  Migraine   Medications  gi cocktail (Maalox,Lidocaine,Donnatal) (30 mLs Oral Given 03/16/14 1142)  ketorolac (TORADOL) injection 60 mg (60 mg Intramuscular Given 03/16/14 1238)  aspirin tablet 325 mg (325 mg Oral Given 03/16/14 1652)  sodium chloride 0.9 % bolus 1,000 mL (1,000 mLs Intravenous New Bag/Given 03/16/14 1652)  diphenhydrAMINE (BENADRYL) injection 12.5 mg (12.5 mg Intravenous Given 03/16/14 1658)  metoCLOPramide (REGLAN) injection 10 mg (10 mg Intravenous Given 03/16/14 1658)   Filed Vitals:   03/16/14 1206 03/16/14 1209 03/16/14 1212 03/16/14 1537  BP: 107/75 108/86 100/85 116/73  Pulse: 64 141 79 69  Temp:    98.1 F (36.7 C)  TempSrc:    Oral  Resp: 18 18 18 16   Height:    4\' 11"  (1.499 m)  Weight:    112 lb (50.803 kg)  SpO2:    99%    Patient presenting to the ED with chest pain, shortness of breath, lightheadedness, headaches. Patient reported the headaches have been ongoing for the past 2-3 months intermittently described as a pulsating sensation localized to the temporal regions bilaterally  with bilateral blurred vision, phonophobia, photophobia, numbness and tingling to the cheeks and feeling flushed. Stated that these are sporadic and has been using aspirin with minimal relief. Stated that she's been experiencing chest pain for the past 4 weeks pleuritic described as a pushing, pressure sensation that is intermittent lasting anywhere from minutes to hours with associated shortness of breath-reported that the chest pain comes on sporadically no trend identified with rest or exertion. Reported that she's been on Mirena for 1 year. Patient denied history of hypertension, cardiac issues, diabetes. Alert and oriented. GCS 15. Heart rate and rhythm normal. Lungs clear to auscultation to upper and lower lobes bilaterally. Good lung expansion. Negative signs of respiratory distress. Radial and DP pulses 2+ bilaterally. Cap refill less than 3 seconds. Negative swelling or pitting edema identified to lower extremities bilaterally. Negative pain upon palpation to the chest  wall-negative crepitus. Cranial nerves grossly intact. Mild horizontal nystagmus noted with motion of the eyes. Gait proper with-negative step offs or sway. Patient is able to bring finger to nose bilaterally without difficulty. Able to bring heel to knee down shin bilaterally without difficulty or ataxia. Strength intact upper and lower extremities bilaterally-equal distribution noted. Negative focal neurological deficits identified. EKG noted normal sinus rhythm with a heart rate of 71 beats per minute-negative ischemic findings noted. Troponin negative elevation. D-dimer negative elevation. CBC negative elevated white cell count noted. CMP negative findings. Glucose 84. Urine pregnancy negative. Urine drug screen negative findings. Urinalysis noted small hemoglobin-negative nitrites and leukocytes identified. Negative findings of pyuria. Urine drug screen negative. Chest x-ray negative for acute cardiopulmonary disease. Doubt PE. HEART  score 0 - doubt cardiac issue. Doubt ICH. Doubt SAH. Doubt meningitis. Suspicion to be atypical chest pain. Suspicion to be migraine secondary to symptoms - gradually getting worse. Patient responded well to fluids and medications. Patient stable, afebrile. Discharged patient. Discussed with patient to rest and stay hydrated. Referred patient to [facility]ology [facility]. Recommended stress test and holter monitor to be performed as an outpatient. Discussed with patient to closely monitor symptoms and if symptoms are to worsen or change to report back to the ED - strict return instructions given.  Patient agreed to plan of care, understood, all questions answered.    Jamse Mead, PA-C 03/17/14 [phone]

## 2014-03-16 NOTE — Discharge Instructions (Signed)
Please call your doctor for a followup appointment within 24-48 hours. When you talk to your doctor please let them know that you were seen in the emergency department and have them acquire all of your records so that they can discuss the findings with you and formulate a treatment plan to fully care for your new and ongoing problems. Please call and set-up an appointment with Cardiology to be assessed Please rest and stay hydrated Can continue to take ASA 81 mg as needed for chest pain Please continue to monitor symptoms closely and if symptoms are to worsen or change (fever greater than 101, chills, chest pain, shortness of breath, difficulty breathing, numbness, tingling, weakness, inability to control urine or bowel movements, shortness of breath, inability to move, facial drooping, slurred speech) please report back to the ED immediately   Chest Pain (Nonspecific) Chest pain has many causes. Your pain could be caused by something serious, such as a heart attack or a blood clot in the lungs. It could also be caused by something less serious, such as a chest bruise or a virus. Follow up with your doctor. More lab tests or other studies may be needed to find the cause of your pain. Most of the time, nonspecific chest pain will improve within 2 to 3 days of rest and mild pain medicine. HOME CARE  For chest bruises, you may put ice on the sore area for 15-20 minutes, 03-04 times a day. Do this only if it makes you feel better.  Put ice in a plastic bag.  Place a towel between the skin and the bag.  Rest for the next 2 to 3 days.  Go back to work if the pain improves.  See your doctor if the pain lasts longer than 1 to 2 weeks.  Only take medicine as told by your doctor.  Quit smoking if you smoke. GET HELP RIGHT AWAY IF:   There is more pain or pain that spreads to the arm, neck, jaw, back, or belly (abdomen).  You have shortness of breath.  You cough more than usual or cough up  blood.  You have very bad back or belly pain, feel sick to your stomach (nauseous), or throw up (vomit).  You have very bad weakness.  You pass out (faint).  You have a fever. Any of these problems may be serious and may be an emergency. Do not wait to see if the problems will go away. Get medical help right away. Call your local emergency services 911 in [place] Do not drive yourself to the hospital. MAKE SURE YOU:   Understand these instructions.  Will watch this condition.  Will get help right away if you or your child is not doing well or gets worse. Document Released: 05/28/2008 Document Revised: 03/03/2012 Document Reviewed: 05/28/2008 [facility] Patient Information 2014 [place], [place].   Emergency Department Resource Guide 1) Find a Doctor and Pay Out of Pocket Although you won't have to find out who is covered by your insurance plan, it is a good idea to ask around and get recommendations. You will then need to call the office and see if the doctor you have chosen will accept you as a new patient and what types of options they offer for patients who are self-pay. Some doctors offer discounts or will set up payment plans for their patients who do not have insurance, but you will need to ask so you aren't surprised when you get to your appointment.  2) Contact  Your Local Health Department Not all health departments have doctors that can see patients for sick visits, but many do, so it is worth a call to see if yours does. If you don't know where your local health department is, you can check in your phone book. The CDC also has a tool to help you locate your state's health department, and many state websites also have listings of all of their local health departments.  3) Find a Walk-in Clinic If your illness is not likely to be very severe or complicated, you may want to try a walk in clinic. These are popping up all over the country in pharmacies, drugstores, and shopping centers.  They're usually staffed by nurse practitioners or physician assistants that have been trained to treat common illnesses and complaints. They're usually fairly quick and inexpensive. However, if you have serious medical issues or chronic medical problems, these are probably not your best option.  No Primary Care Doctor: - Call Health Connect at  [phone] - they can help you locate a primary care doctor that  accepts your insurance, provides certain services, etc. - Physician Referral Service- [phone]  Chronic Pain Problems: Organization         Address  Phone   Notes  [facility]  [phone] Patients need to be referred by their primary care doctor.   Medication Assistance: Organization         Address  Phone   Notes  [facility] Medication [facility] [address] [place], [place] [address] [phone] --Must be a resident of [facility] [place] -- Must have NO insurance coverage whatsoever (no Medicaid/ Medicare, etc.) -- The pt. MUST have a primary care doctor that directs their care regularly and follows them in the community   MedAssist  [phone]   [employer]  [phone]) [phone]    Agencies that provide inexpensive medical care: Organization         Address  Phone   Notes  [facility]  [phone]) [phone]   [facility]    [phone]   [facility] [address] [place], [place] [address] [phone]) [phone]   [facility] [address], [place] [phone]) [phone]   [facility]anned Parenthood    [phone]   [facility]    [phone]) [phone]   [facility]  [address], [place] Phone:  [phone](336) [phone], Fax:  [phone] Hours of Operation:  9 am - 6 pm, M-F.  Also accepts Medicaid/Medicare and self-pay.  [facility]  [address], [place] Phone: [phone], Fax: [phone](336)  [phone]. Hours of Operation:  8:30 am - 5:30 pm, M-F.  Also accepts Medicaid and self-pay.  [facility] [place] [address], [place] Phone: [phone]) [phone]   [facility] [address], [place], [place] [phone], Ext. [phone] Mondays & Thursdays: 7-9 AM.  First 15 patients are seen on a first come, first serve basis.    Medicaid-accepting [facility] Providers:  Organization         Address  Phone   Notes  [facility] [address], [place] [phone]) [phone] Also accepts self-pay patients.  [facility] [address], [place]  [phone]   [facility] [address], [address] [place] [place] (  [phone]   [facility] [address], [place] [phone]   Lucianne Lei [address], [place]   [phone] Only accepts [place] Access [place] patients after they have their name applied to their card.   Self-Pay (no insurance) in [facility]:  Organization         Address  Phone   Notes  Sickle Cell Patients, [facility] [place] [phone]   [facility] [place] [phone]   [facility] [place]  [place], [place], [place] [phone]   [facility]/Dr. Osei-Bonsu  [address] [place] [address], [place] [phone] Phone number for both [place] and [place] locations is the same.  [facility]gent [facility] and [facility] [address] [place] [phone]   [facility] [address], [place] or [address] [phone]   [facility] [address], [place] [phone], phone; [phone], fax Sees patients 1st and 3rd Saturday of every month.  Must not qualify for public or private insurance (i.e.  Medicaid, Medicare, [place] Health Choice, Veterans' Benefits)  Household income should be no more than 200% of the poverty level The clinic cannot treat you if you are pregnant or think you are pregnant  Sexually transmitted diseases are not treated at the clinic.    Dental Care: Organization         Address  Phone  Notes  [facility] Department of [facility] [place] [phone] Accepts children up to age 75 who are enrolled in [place] or [place]; pregnant women with a Medicaid card; and children who have applied for Medicaid or [place] Health Choice, but were declined, whose parents can pay a reduced fee at time of service.  [facility] Department of [facility]  [address], [place] [phone] Accepts children up to age 67 who are enrolled in [place] or [place]; pregnant women with a Medicaid card; and children who have applied for Medicaid or  Health Choice, but were declined, whose parents can pay a reduced fee at time of service.  [place] [facility] Access PROGRAM  [place] [phone] Patients are seen by appointment only. Walk-ins are not accepted. [place] will see patients 34 years of age and older. Monday - Tuesday (8am-5pm) Most Wednesdays (8:30-5pm) $30 per visit, cash only  [facility] Adult Dental Access PROGRAM  [address], [facility] [phone] Patients are seen by appointment only. Walk-ins are not accepted. [place] will see patients 58 years of age and older. One Wednesday Evening (Monthly: Volunteer Based).  $30 per visit, cash only  [place]  [phone] for adults; Children under age 1, call Graduate Pediatric Dentistry at [phone]. Children aged 61-14, please call [phone] to request a pediatric application.  Dental services are provided in all areas of dental care including fillings,  crowns and bridges, complete and partial dentures, implants, gum treatment, root canals, and extractions. Preventive care is also provided. Treatment is provided to both adults and children. Patients are selected via a lottery and there is often a waiting list.   [facility] [address], [place]  [phone] [email or web address]   [facility] [address] [place], [place] [phone], Ext. [phone] Second and Fourth Thursday of each month, opens at 6:30 AM; Clinic ends at 9 AM.  Patients are seen on a first-come first-served basis, and a limited number are seen during each clinic.   [facility]  [address] Hillard Danker [place], [place] [phone]   Eligibility Requirements You must have lived in [place], [place], or [place] counties for at least the last three months.   You cannot be eligible for state or federal sponsored [employer], including [employer], [place], or [employer].   You generally cannot be eligible for healthcare insurance through your employer.    How to apply: Eligibility screenings are held every Tuesday and Wednesday afternoon from 1:00 pm until 4:00 pm. You do not need an appointment for the interview!  [facility] [address] [place], [place]   [place]  [place] Department  [place]  [phone]    Behavioral Health Resources in the Community: Intensive Outpatient Programs Organization         Address  Phone  Notes  [place]. [address] [place], [place] [phone]   [facility] [address], [place], [place]   ADS: Alcohol & Drug Svcs [address], [place], [place]   [place] [address],  [place], [place] or [phone]   Substance Abuse  Resources Organization         Address  Phone  Notes  Alcohol and Drug Services  [phone]   [place]  [phone]   [facility] [place]   Chinita Pester  [phone]   Residential & Outpatient Substance Abuse Program  [phone]   Psychological Services Organization         Address  Phone  Notes  [facility]  [place]  [phone]   [place] [address], [place] or [phone]    Mobile Crisis Teams Organization         Address  Phone  Notes  [facility], Mobile Crisis Care Unit  [phone]   [facility]ive Psychotherapeutic Services  [address] [place], [place]   [place] [address] [place] [phone]    Self-Help/Support Groups Organization         Address  Phone             Notes  [place]. of [place] - variety of support groups  [place] Call for more information  Narcotics Anonymous (NA), [facility]aring [facility] [address], [employer] [place]  2 meetings at this location   Special educational needs teacher         Address  Phone  Notes  [facility] Residential Treatment [place],    [place]  [phone]   [facility]  [address] [place] [phone], [place], [place]   [place], [place] [phone] Admissions: 8am-3pm M-F  Incentives Substance [place] [address],    [place], [place] [phone]   [facility] [address] Jadene Pierini [place], [place]   [facility] [address],  [place], [place]   [facility]ight Programs -  Intensive Outpatient [address], [place], [place] [phone]   [facility] ([place].,  [place], [place] [phone] or [phone]   [facility]idential Treatment Services (RTS) [address], [place], [place] Accepts Medicaid  [facility] [place] [address]  [place] [phone] Substance Abuse/Addiction Treatment   [facility] Organization         Address  Phone  Notes  [facility]  [phone]   Domenic Schwab, PhD [address] Arlis [place], [place]   [phone] or [phone]   [facility] [place] [facility] [place], [place] [phone]   [address], [place], [place] [phone] Insurance/Medicaid/sponsorship through [facility] and Families [address] [place]                                    [place], [place] [phone] [place] [address], [place] [phone]    Dr. Adele Schilder  [phone]   [facility] Dept. 1) [address]  2) [place] 3)  [place] [address], [place] [phone]  [phone]   [place] [phone] or [phone] (After Hours)        Filed Vitals:   03/16/14 1206 03/16/14 1209 03/16/14 1212 03/16/14 1537  BP: 107/75 108/86 100/85 116/73  Pulse: 64 141 79 69  Temp:    98.1 F (36.7 C)  TempSrc:    Oral  Resp: 18 18 18 16   Height:    4\' 11"  (1.499 m)  Weight:    112 lb (50.803 kg)  SpO2:    99%

## 2014-03-16 NOTE — ED Notes (Signed)
[facility]  Pt has c/o cp x 1 month. sts that lately it has gotten worse.

## 2014-03-16 NOTE — MAU Note (Signed)
Patient presents to MAU with c/o chest pain that has been intermittent for the last 4 weeks. Reports having shortness of breath, blurry vision, and tingling/numbness of face when symptom presents.

## 2014-03-16 NOTE — MAU Provider Note (Signed)
History     CSN: [id number]  Arrival date and time: 03/16/14 1050   First Provider Initiated Contact with Patient 03/16/14 1109      Chief Complaint  Patient presents with  . Chest Pain  . Shortness of Breath  . light headed    HPI  Ms. Jaime [name] is a 38 y.o. female G2P0202 who presents to MAU with complaints of chest pain times 4 weeks, along with shortness of breath and dizziness.  The patient currently rates her chest pain 6/10; the pain is described as heaviness, with a pushing feeling in her chest wall. The pain currently is constant, however in the past 4 weeks the pain has come and gone. The patient has been taking bayer ASA at the time of the onset of chest pain and states that she feels relief 60 mins after taking it. She has not taken ASA today.  She also describes SOB; described as constant for the last 4 weeks (although not having SOB now) walking or sitting does not make a difference. At times she can exacerbate the chest pain by pushing on her chest wall.  Denies history of asthma, anxiety, panic attacks, heart attack, and stroke.  Patient denies drug use, tobacco use or alcohol use.   OB History   Grav Para Term Preterm Abortions TAB SAB Ect Mult Living   2 2  2      2       No past medical history on file.  Past Surgical History  Procedure Laterality Date  . Bartholin cyst marsupialization  03/2010  . Wisdom tooth extraction  2011    No family history on file.  History  Substance Use Topics  . Smoking status: Never Smoker   . Smokeless tobacco: Not on file  . Alcohol Use: No    Allergies: No Known Allergies  Facility-administered medications prior to admission  Medication Dose Route Frequency Provider Last Rate Last Dose  . ibuprofen (ADVIL,MOTRIN) tablet 600 mg  600 mg Oral Once [place], CNM       Prescriptions prior to admission  Medication Sig Dispense Refill  . fish oil-omega-3 fatty acids 1000 MG capsule Take 2 g by mouth daily.         Marland [place] levonorgestrel (MIRENA) 20 MCG/24HR IUD 1 each by Intrauterine route once.  1 each  0  . Multiple Vitamin (MULTIVITAMIN) capsule Take 1 capsule by mouth daily.         Results for orders placed during the hospital encounter of 03/16/14 (from the past 48 hour(s))  URINE RAPID DRUG SCREEN (HOSP PERFORMED)     Status: None   Collection Time    03/16/14 11:02 AM      Result Value Ref Range   Opiates NONE DETECTED  NONE DETECTED   Cocaine NONE DETECTED  NONE DETECTED   Benzodiazepines NONE DETECTED  NONE DETECTED   Amphetamines NONE DETECTED  NONE DETECTED   Tetrahydrocannabinol NONE DETECTED  NONE DETECTED   Barbiturates NONE DETECTED  NONE DETECTED   Comment:            DRUG SCREEN FOR MEDICAL PURPOSES     ONLY.  IF CONFIRMATION IS NEEDED     FOR ANY PURPOSE, NOTIFY LAB     WITHIN 5 DAYS.                LOWEST DETECTABLE LIMITS     FOR URINE DRUG SCREEN     Drug Class  Cutoff (ng/mL)     Amphetamine      1000     Barbiturate      200     Benzodiazepine   921     Tricyclics       194     Opiates          300     Cocaine          300     THC              50     Performed at [facility] MICROSCOPIC     Status: Abnormal   Collection Time    03/16/14 11:02 AM      Result Value Ref Range   Color, Urine YELLOW  YELLOW   APPearance CLEAR  CLEAR   Specific Gravity, Urine <1.005 (*) 1.005 - 1.030   pH 6.0  5.0 - 8.0   Glucose, UA NEGATIVE  NEGATIVE mg/dL   Hgb urine dipstick SMALL (*) NEGATIVE   Bilirubin Urine NEGATIVE  NEGATIVE   Ketones, ur NEGATIVE  NEGATIVE mg/dL   Protein, ur NEGATIVE  NEGATIVE mg/dL   Urobilinogen, UA 0.2  0.0 - 1.0 mg/dL   Nitrite NEGATIVE  NEGATIVE   Leukocytes, UA NEGATIVE  NEGATIVE  URINE MICROSCOPIC-ADD ON     Status: None   Collection Time    03/16/14 11:02 AM      Result Value Ref Range   Squamous Epithelial / LPF RARE  RARE   WBC, UA 0-2  <3 WBC/hpf   RBC / HPF 0-2  <3 RBC/hpf  POCT PREGNANCY,  URINE     Status: None   Collection Time    03/16/14 11:10 AM      Result Value Ref Range   Preg Test, Ur NEGATIVE  NEGATIVE   Comment:            THE SENSITIVITY OF THIS     METHODOLOGY IS >24 mIU/mL  CBC     Status: None   Collection Time    03/16/14 11:45 AM      Result Value Ref Range   WBC 7.3  4.0 - 10.5 K/uL   RBC 4.08  3.87 - 5.11 MIL/uL   Hemoglobin 12.7  12.0 - 15.0 g/dL   HCT 36.9  36.0 - 46.0 %   MCV 90.4  78.0 - 100.0 fL   MCH 31.1  26.0 - 34.0 pg   MCHC 34.4  30.0 - 36.0 g/dL   RDW 12.8  11.5 - 15.5 %   Platelets 234  150 - 400 K/uL  COMPREHENSIVE METABOLIC PANEL     Status: Abnormal   Collection Time    03/16/14 11:45 AM      Result Value Ref Range   Sodium 136 (*) 137 - 147 mEq/L   Potassium 4.0  3.7 - 5.3 mEq/L   Chloride 100  96 - 112 mEq/L   CO2 23  19 - 32 mEq/L   Glucose, Bld 91  70 - 99 mg/dL   BUN 20  6 - 23 mg/dL   Creatinine, Ser 0.67  0.50 - 1.10 mg/dL   Calcium 9.2  8.4 - 10.5 mg/dL   Total Protein 7.0  6.0 - 8.3 g/dL   Albumin 4.0  3.5 - 5.2 g/dL   AST 20  0 - 37 U/L   ALT 24  0 - 35 U/L   Alkaline Phosphatase 80  39 -  117 U/L   Total Bilirubin <0.2 (*) 0.3 - 1.2 mg/dL   Comment: REPEATED TO VERIFY   GFR calc non Af Amer >90  >90 mL/min   GFR calc Af Amer >90  >90 mL/min   Comment: (NOTE)     The eGFR has been calculated using the CKD EPI equation.     This calculation has not been validated in all clinical situations.     eGFR's persistently <90 mL/min signify possible Chronic Kidney     Disease.  GLUCOSE, CAPILLARY     Status: None   Collection Time    03/16/14 11:57 AM      Result Value Ref Range   Glucose-Capillary 84  70 - 99 mg/dL    Review of Systems  Constitutional: Positive for chills.  Eyes: Positive for blurred vision.  Respiratory: Negative for shortness of breath (None currently ).   Cardiovascular: Positive for chest pain and palpitations.  Gastrointestinal: Negative for nausea, vomiting, abdominal pain, diarrhea and  constipation.  Neurological: Positive for dizziness.  Psychiatric/Behavioral: The patient is not nervous/anxious.    Physical Exam   Blood pressure 120/68, pulse 62, temperature 97.4 F (36.3 C), temperature source Oral, resp. rate 16, height 4' 11"  (1.499 m), weight 51.891 kg (114 lb 6.4 oz), SpO2 100.00%.  Physical Exam  Constitutional: She is oriented to person, place, and time. She appears well-developed and well-nourished.  Non-toxic appearance. She does not have a sickly appearance. She does not appear ill. No distress.  Cardiovascular: Regular rhythm and normal heart sounds.  Bradycardia present.   Respiratory: Effort normal and breath sounds normal. No respiratory distress. She exhibits tenderness.    Musculoskeletal: Normal range of motion.  Pt walked into MAU; talking on her cell phone   Neurological: She is alert and oriented to person, place, and time.  Skin: She is not diaphoretic.  Psychiatric: Her mood appears anxious.    MAU Course  Procedures None  MDM EKG; bradycardia, otherwise normal sinus rhythm. History of bradycardia-- see other notes in chart.  Gi cocktail given; pt rates her chest pain 4/10 following gi cocktail, and continues to describe the pain as pressure in her chest.  1250: pt is resting in bed, texting on her cell phone. Toradol 60 mg IM   1330: no relief from Toradol  1415: pt rates her chest pain 3/10.  1430: Discussed patient with Dr. Kennon Rounds; plan in place to transfer [facility] for further evaluation of chest pain.-- Dr. Jeneen Rinks at [facility] ED accepts care of the patient. Transfer in process. No IV or ASA preferred prior to transfer per Dr. Jeneen Rinks.  Pulse ox 100% on room air.   Assessment and Plan   A:  50 -year-old non pregnant female Chest pain  Dizziness  Bradycardia    P:  Transfer to [facility] for further evaluation  Darrelyn Hillock Rasch, NP  03/16/2014, 3:03 PM

## 2014-03-16 NOTE — ED Notes (Signed)
Pt returned from ct

## 2014-03-16 NOTE — ED Notes (Signed)
Pt dc to home. Pt sts understanding to dc instructions.pt ambulatory to exit. nadn.

## 2014-03-16 NOTE — MAU Provider Note (Signed)
Attestation of Attending Supervision of Advanced Practitioner (PA/CNM/NP): Evaluation and management procedures were performed by the Advanced Practitioner under my supervision and collaboration.  I have reviewed the Advanced Practitioner's note and chart, and I agree with the management and plan.  Donnamae Jude, MD [facility] [place] Attending 03/16/2014 3:15 PM

## 2014-03-18 NOTE — ED Provider Notes (Signed)
Medical screening examination/treatment/procedure(s) were conducted as a shared visit with non-physician practitioner(s) or resident and myself. I personally evaluated the patient during the encounter and agree with the findings and plan unless otherwise indicated.  I have personally reviewed any xrays and/ or EKG's with the provider and I agree with interpretation.  Healthy 38 year old female sent over from [facility] for worsening chest pain for one month. Pain is intermittent timing is variable, no specific exertional symptoms. Patient denies all pulmonary embolism and DVT risk factors except she has a Mirena currently. No cardiac risk factors. No fevers next deafness.Wild worsening with palpation. Mild radiation to left shoulder and left arm. Exam patient is well appearing neck is supple full range of motion cranial nerves are intact, heart is regular rate and rhythm no murmurs, lungs are clear, no leg swelling or leg pain, moist membranes, equal strength bilateral. Low suspicion for any emergent etiology at this time however with persistent chest pain which is pleuritic at times we will obtain a d-dimer, blood work, and possibly CT angiogram the chest pending results. D dimer neg.  EKG no acute findings.  Follow up outpatient.    EKG Interpretation   Date/Time:  Tuesday March 16 2014 15:41:44 EDT Ventricular Rate:  71 PR Interval:  130 QRS Duration: 80 QT Interval:  412 QTC Calculation: 448 R Axis:   40 Text Interpretation:  Age not entered, assumed to be  38 years old for  purpose of ECG interpretation Sinus rhythm Baseline wander in lead(s) V5  Confirmed by Reather Converse  MD, Natale Barba ([id number]) on 03/16/2014 5:04:49 PM        Atypical chest pain   Mariea Clonts, MD 03/18/14 1253

## 2014-04-16 ENCOUNTER — Ambulatory Visit: Payer: 59 | Admitting: Internal Medicine

## 2014-05-26 ENCOUNTER — Emergency Department (INDEPENDENT_AMBULATORY_CARE_PROVIDER_SITE_OTHER)
Admission: EM | Admit: 2014-05-26 | Discharge: 2014-05-26 | Disposition: A | Discharge status: Home / Self Care | Payer: 59 | Source: Home / Self Care | Attending: Family Medicine | Admitting: Family Medicine

## 2014-05-26 ENCOUNTER — Encounter (HOSPITAL_COMMUNITY): Payer: Self-pay | Admitting: Emergency Medicine

## 2014-05-26 DIAGNOSIS — J040 Acute laryngitis: Secondary | ICD-10-CM

## 2014-05-26 LAB — POCT RAPID STREP A: STREPTOCOCCUS, GROUP A SCREEN (DIRECT): NEGATIVE

## 2014-05-26 MED ORDER — PREDNISONE 10 MG PO TABS: 30.0000 mg | ORAL_TABLET | Freq: Every day | ORAL | Status: DC

## 2014-05-26 MED ORDER — IPRATROPIUM BROMIDE 0.06 % NA SOLN: 2.0000 | Freq: Four times a day (QID) | NASAL | Status: DC

## 2014-05-26 NOTE — ED Notes (Signed)
Pt c/o cold sx onset 4 days Sx include: fevers, cough, congestion, ST, HA, chills Denies v/n/d, SOB, wheezing Taking ibup w/temp relief Alert w/no signs of acute distress.

## 2014-05-26 NOTE — Discharge Instructions (Signed)
Thank you for coming in today. Take the prednisone and use the nasal spray.  Come back as needed.  Call or go to the emergency room if you get worse, have trouble breathing, have chest pains, or palpitations.    Laryngitis At the top of your windpipe is your voice box. It is the source of your voice. Inside your voice box are 2 bands of muscles called vocal cords. When you breathe, your vocal cords are relaxed and open so that air can get into the lungs. When you decide to say something, these cords come together and vibrate. The sound from these vibrations goes into your throat and comes out through your mouth as sound. Laryngitis is an inflammation of the vocal cords that causes hoarseness, cough, loss of voice, sore throat, and dry throat. Laryngitis can be temporary (acute) or long-term (chronic). Most cases of acute laryngitis improve with time.Chronic laryngitis lasts for more than 3 weeks. CAUSES Laryngitis can often be related to excessive smoking, talking, or yelling, as well as inhalation of toxic fumes and allergies. Acute laryngitis is usually caused by a viral infection, vocal strain, measles or mumps, or bacterial infections. Chronic laryngitis is usually caused by vocal cord strain, vocal cord injury, postnasal drip, growths on the vocal cords, or acid reflux. SYMPTOMS   Cough.  Sore throat.  Dry throat. RISK FACTORS  Respiratory infections.  Exposure to irritating substances, such as cigarette smoke, excessive amounts of alcohol, stomach acids, and workplace chemicals.  Voice trauma, such as vocal cord injury from shouting or speaking too loud. DIAGNOSIS  Your cargiver will perform a physical exam. During the physical exam, your caregiver will examine your throat. The most common sign of laryngitis is hoarseness. Laryngoscopy may be necessary to confirm the diagnosis of this condition. This procedure allows your caregiver to look into the larynx. HOME CARE  INSTRUCTIONS  Drink enough fluids to keep your urine clear or pale yellow.  Rest until you no longer have symptoms or as directed by your caregiver.  Breathe in moist air.  Take all medicine as directed by your caregiver.  Do not smoke.  Talk as little as possible (this includes whispering).  Write on paper instead of talking until your voice is back to normal.  Follow up with your caregiver if your condition has not improved after 10 days. SEEK MEDICAL CARE IF:   You have trouble breathing.  You cough up blood.  You have persistent fever.  You have increasing pain.  You have difficulty swallowing. MAKE SURE YOU:  Understand these instructions.  Will watch your condition.  Will get help right away if you are not doing well or get worse. Document Released: 12/10/2005 Document Revised: 03/03/2012 Document Reviewed: 02/15/2011 [facility] Patient Information 2014 [place], [place].

## 2014-05-26 NOTE — ED Provider Notes (Signed)
Denelda Akerley is a 38 y.o. female who presents to Urgent Care today for sore throat hoarse voice fever headache body and chills. Patient initially notes a mildly productive cough. She denies any shortness of breath nausea vomiting diarrhea or chest pain. Symptoms have been present now for 3 days. She's tried ibuprofen and ginger tea which have not helped very much. She feels well otherwise.   History reviewed. No pertinent past medical history. History  Substance Use Topics  . Smoking status: Never Smoker   . Smokeless tobacco: Not on file  . Alcohol Use: No   ROS as above Medications: No current facility-administered medications for this encounter.   Current Outpatient Prescriptions  Medication Sig Dispense Refill  . levonorgestrel (MIRENA) 20 MCG/24HR IUD 1 each by Intrauterine route once.      . Cholecalciferol (VITAMIN D) 2000 UNITS tablet Take 2,000 Units by mouth daily.      . Cinnamon 500 MG TABS Take 1 tablet by mouth daily.      . ferrous sulfate dried (SLOW FE) 160 (50 FE) MG TBCR SR tablet Take 320 mg by mouth daily.      . fish oil-omega-3 fatty acids 1000 MG capsule Take 2 g by mouth daily.        Marland Kitchen ipratropium (ATROVENT) 0.06 % nasal spray Place 2 sprays into both nostrils 4 (four) times daily.  15 mL  1  . Melatonin 1 MG TABS Take 1 tablet by mouth daily as needed (sleep).      . Multiple Vitamin (MULTIVITAMIN) capsule Take 1 capsule by mouth daily.        . predniSONE (DELTASONE) 10 MG tablet Take 3 tablets (30 mg total) by mouth daily.  15 tablet  0  . vitamin B-12 (CYANOCOBALAMIN) 100 MCG tablet Take 100 mcg by mouth daily.        Exam:  BP 105/67  Pulse 60  Temp(Src) 98.4 F (36.9 C) (Oral)  Resp 18  SpO2 99% Gen: Well NAD HEENT: EOMI,  MMM posterior pharynx with cobblestoning. Tympanic membranes are normal appearing bilaterally. Lungs: Normal work of breathing. CTABL Heart: RRR no MRG Abd: NABS, Soft. NT, ND Exts: Brisk capillary refill, warm and well  perfused.   Results for orders placed during the hospital encounter of 05/26/14 (from the past 24 hour(s))  POCT RAPID STREP A ([place])     Status: None   Collection Time    05/26/14  8:32 AM      Result Value Ref Range   Streptococcus, Group A Screen (Direct) NEGATIVE  NEGATIVE   No results found.  Assessment and Plan: 38 y.o. female with laryngitis secondary to viral URI. Plan to treat with prednisone Atrovent nasal spray. Followup as needed.  Discussed warning signs or symptoms. Please see discharge instructions. Patient expresses understanding.    Gregor Hams, MD 05/26/14 [phone]

## 2014-05-28 LAB — CULTURE, GROUP A STREP

## 2014-10-25 ENCOUNTER — Encounter (HOSPITAL_COMMUNITY): Payer: Self-pay | Admitting: Emergency Medicine

## 2015-02-15 ENCOUNTER — Emergency Department (INDEPENDENT_AMBULATORY_CARE_PROVIDER_SITE_OTHER)
Admission: EM | Admit: 2015-02-15 | Discharge: 2015-02-15 | Disposition: A | Discharge status: Home / Self Care | Payer: 59 | Source: Home / Self Care | Attending: Family Medicine | Admitting: Family Medicine

## 2015-02-15 ENCOUNTER — Encounter (HOSPITAL_COMMUNITY): Payer: Self-pay | Admitting: *Deleted

## 2015-02-15 DIAGNOSIS — K529 Noninfective gastroenteritis and colitis, unspecified: Secondary | ICD-10-CM

## 2015-02-15 MED ORDER — ONDANSETRON HCL 4 MG PO TABS: 4.0000 mg | ORAL_TABLET | Freq: Three times a day (TID) | ORAL | Status: DC | PRN

## 2015-02-15 MED ORDER — LOPERAMIDE HCL 2 MG PO CAPS: 2.0000 mg | ORAL_CAPSULE | Freq: Four times a day (QID) | ORAL | Status: DC | PRN

## 2015-02-15 NOTE — Discharge Instructions (Signed)
Food Choices to Help Relieve Diarrhea °When you have diarrhea, the foods you eat and your eating habits are very important. Choosing the right foods and drinks can help relieve diarrhea. Also, because diarrhea can last up to 7 days, you need to replace lost fluids and electrolytes (such as sodium, potassium, and chloride) in order to help prevent dehydration.  °WHAT GENERAL GUIDELINES DO I NEED TO FOLLOW? °· Slowly drink 1 cup (8 oz) of fluid for each episode of diarrhea. If you are getting enough fluid, your urine will be clear or pale yellow. °· Eat starchy foods. Some good choices include white rice, white toast, pasta, low-fiber cereal, baked potatoes (without the skin), saltine crackers, and bagels. °· Avoid large servings of any cooked vegetables. °· Limit fruit to two servings per day. A serving is ½ cup or 1 small piece. °· Choose foods with less than 2 g of fiber per serving. °· Limit fats to less than 8 tsp (38 g) per day. °· Avoid fried foods. °· Eat foods that have probiotics in them. Probiotics can be found in certain dairy products. °· Avoid foods and beverages that may increase the speed at which food moves through the stomach and intestines (gastrointestinal tract). Things to avoid include: °¨ High-fiber foods, such as dried fruit, raw fruits and vegetables, nuts, seeds, and whole grain foods. °¨ Spicy foods and high-fat foods. °¨ Foods and beverages sweetened with high-fructose corn syrup, honey, or sugar alcohols such as xylitol, sorbitol, and mannitol. °WHAT FOODS ARE RECOMMENDED? °Grains °White rice. White, French, or pita breads (fresh or toasted), including plain rolls, buns, or bagels. White pasta. Saltine, soda, or graham crackers. Pretzels. Low-fiber cereal. Cooked cereals made with water (such as cornmeal, farina, or cream cereals). Plain muffins. Matzo. Melba toast. Zwieback.  °Vegetables °Potatoes (without the skin). Strained tomato and vegetable juices. Most well-cooked and canned  vegetables without seeds. Tender lettuce. °Fruits °Cooked or canned applesauce, apricots, cherries, fruit cocktail, grapefruit, peaches, pears, or plums. Fresh bananas, apples without skin, cherries, grapes, cantaloupe, grapefruit, peaches, oranges, or plums.  °Meat and Other Protein Products °Baked or boiled chicken. Eggs. Tofu. Fish. Seafood. Smooth peanut butter. Ground or well-cooked tender beef, ham, veal, lamb, pork, or poultry.  °Dairy °Plain yogurt, kefir, and unsweetened liquid yogurt. Lactose-free milk, buttermilk, or soy milk. Plain hard cheese. °Beverages °Sport drinks. Clear broths. Diluted fruit juices (except prune). Regular, caffeine-free sodas such as ginger ale. Water. Decaffeinated teas. Oral rehydration solutions. Sugar-free beverages not sweetened with sugar alcohols. °Other °Bouillon, broth, or soups made from recommended foods.  °The items listed above may not be a complete list of recommended foods or beverages. Contact your dietitian for more options. °WHAT FOODS ARE NOT RECOMMENDED? °Grains °Whole grain, whole wheat, bran, or rye breads, rolls, pastas, crackers, and cereals. Wild or brown rice. Cereals that contain more than 2 g of fiber per serving. Corn tortillas or taco shells. Cooked or dry oatmeal. Granola. Popcorn. °Vegetables °Raw vegetables. Cabbage, broccoli, Brussels sprouts, artichokes, baked beans, beet greens, corn, kale, legumes, peas, sweet potatoes, and yams. Potato skins. Cooked spinach and cabbage. °Fruits °Dried fruit, including raisins and dates. Raw fruits. Stewed or dried prunes. Fresh apples with skin, apricots, mangoes, pears, raspberries, and strawberries.  °Meat and Other Protein Products °Chunky peanut butter. Nuts and seeds. Beans and lentils. Bacon.  °Dairy °High-fat cheeses. Milk, chocolate milk, and beverages made with milk, such as milk shakes. Cream. Ice cream. °Sweets and Desserts °Sweet rolls, doughnuts, and sweet breads. Pancakes   and waffles. °Fats and  Oils °Butter. Cream sauces. Margarine. Salad oils. Plain salad dressings. Olives. Avocados.  °Beverages °Caffeinated beverages (such as coffee, tea, soda, or energy drinks). Alcoholic beverages. Fruit juices with pulp. Prune juice. Soft drinks sweetened with high-fructose corn syrup or sugar alcohols. °Other °Coconut. Hot sauce. Chili powder. Mayonnaise. Gravy. Cream-based or milk-based soups.  °The items listed above may not be a complete list of foods and beverages to avoid. Contact your dietitian for more information. °WHAT SHOULD I DO IF I BECOME DEHYDRATED? °Diarrhea can sometimes lead to dehydration. Signs of dehydration include dark urine and dry mouth and skin. If you think you are dehydrated, you should rehydrate with an oral rehydration solution. These solutions can be purchased at pharmacies, retail stores, or online.  °Drink ½-1 cup (120-240 mL) of oral rehydration solution each time you have an episode of diarrhea. If drinking this amount makes your diarrhea worse, try drinking smaller amounts more often. For example, drink 1-3 tsp (5-15 mL) every 5-10 minutes.  °A general rule for staying hydrated is to drink 1½-2 L of fluid per day. Talk to your health care provider about the specific amount you should be drinking each day. Drink enough fluids to keep your urine clear or pale yellow. °Document Released: 03/01/2004 Document Revised: 12/15/2013 Document Reviewed: 11/02/2013 °ExitCare® Patient Information ©2015 [employer]. This information is not intended to replace advice given to you by your health care provider. Make sure you discuss any questions you have with your health care provider. °Viral Gastroenteritis °Viral gastroenteritis is also known as stomach flu. This condition affects the stomach and intestinal tract. It can cause sudden diarrhea and vomiting. The illness typically lasts 3 to 8 days. Most people develop an immune response that eventually gets rid of the virus. While this natural  response develops, the virus can make you quite ill. °CAUSES  °Many different viruses can cause gastroenteritis, such as rotavirus or noroviruses. You can catch one of these viruses by consuming contaminated food or water. You may also catch a virus by sharing utensils or other personal items with an infected person or by touching a contaminated surface. °SYMPTOMS  °The most common symptoms are diarrhea and vomiting. These problems can cause a severe loss of body fluids (dehydration) and a body salt (electrolyte) imbalance. Other symptoms may include: °· Fever. °· Headache. °· Fatigue. °· Abdominal pain. °DIAGNOSIS  °Your caregiver can usually diagnose viral gastroenteritis based on your symptoms and a physical exam. A stool sample may also be taken to test for the presence of viruses or other infections. °TREATMENT  °This illness typically goes away on its own. Treatments are aimed at rehydration. The most serious cases of viral gastroenteritis involve vomiting so severely that you are not able to keep fluids down. In these cases, fluids must be given through an intravenous line (IV). °HOME CARE INSTRUCTIONS  °· Drink enough fluids to keep your urine clear or pale yellow. Drink small amounts of fluids frequently and increase the amounts as tolerated. °· Ask your caregiver for specific rehydration instructions. °· Avoid: °¨ Foods high in sugar. °¨ Alcohol. °¨ Carbonated drinks. °¨ Tobacco. °¨ Juice. °¨ Caffeine drinks. °¨ Extremely hot or cold fluids. °¨ Fatty, greasy foods. °¨ Too much intake of anything at one time. °¨ Dairy products until 24 to 48 hours after diarrhea stops. °· You may consume probiotics. Probiotics are active cultures of beneficial bacteria. They may lessen the amount and number of diarrheal stools in adults. Probiotics   can be found in yogurt with active cultures and in supplements. °· Wash your hands well to avoid spreading the virus. °· Only take over-the-counter or prescription medicines for  pain, discomfort, or fever as directed by your caregiver. Do not give aspirin to children. Antidiarrheal medicines are not recommended. °· Ask your caregiver if you should continue to take your regular prescribed and over-the-counter medicines. °· Keep all follow-up appointments as directed by your caregiver. °SEEK IMMEDIATE MEDICAL CARE IF:  °· You are unable to keep fluids down. °· You do not urinate at least once every 6 to 8 hours. °· You develop shortness of breath. °· You notice blood in your stool or vomit. This may look like coffee grounds. °· You have abdominal pain that increases or is concentrated in one small area (localized). °· You have persistent vomiting or diarrhea. °· You have a fever. °· The patient is a child younger than 3 months, and he or she has a fever. °· The patient is a child older than 3 months, and he or she has a fever and persistent symptoms. °· The patient is a child older than 3 months, and he or she has a fever and symptoms suddenly get worse. °· The patient is a baby, and he or she has no tears when crying. °MAKE SURE YOU:  °· Understand these instructions. °· Will watch your condition. °· Will get help right away if you are not doing well or get worse. °Document Released: 12/10/2005 Document Revised: 03/03/2012 Document Reviewed: 09/26/2011 °ExitCare® Patient Information ©2015 [employer]. This information is not intended to replace advice given to you by your health care provider. Make sure you discuss any questions you have with your health care provider. ° °

## 2015-02-15 NOTE — ED Provider Notes (Signed)
CSN: [id number]     Arrival date & time 02/15/15  3244 History   First MD Initiated Contact with Patient 02/15/15 1039     Chief Complaint  Patient presents with  . Diarrhea   (Consider location/radiation/quality/duration/timing/severity/associated sxs/prior Treatment) Patient is a 39 y.o. female presenting with diarrhea. The history is provided by the patient.  Diarrhea Quality:  Watery Severity:  Moderate Onset quality:  Gradual Duration:  3 days Timing:  Intermittent Progression:  Unchanged Associated symptoms: chills and fever   Associated symptoms: no abdominal pain, no recent cough, no diaphoresis, no headaches and no vomiting   Associated symptoms comment:  +nausea Risk factors: no recent antibiotic use, no sick contacts, no suspicious food intake and no travel to endemic areas     History reviewed. No pertinent past medical history. Past Surgical History  Procedure Laterality Date  . Bartholin cyst marsupialization  03/2010  . Wisdom tooth extraction  2011   History reviewed. No pertinent family history. History  Substance Use Topics  . Smoking status: Never Smoker   . Smokeless tobacco: Not on file  . Alcohol Use: No   OB History    Gravida Para Term Preterm AB TAB SAB Ectopic Multiple Living   2 2  2      2      Review of Systems  Constitutional: Positive for fever, chills and appetite change. Negative for diaphoresis.  Respiratory: Negative.   Cardiovascular: Negative.   Gastrointestinal: Positive for nausea and diarrhea. Negative for vomiting, abdominal pain, constipation, blood in stool and abdominal distention.  Neurological: Negative for headaches.    Allergies  Review of patient's allergies indicates no known allergies.  Home Medications   Prior to Admission medications   Medication Sig Start Date End Date Taking? Authorizing Provider  Cholecalciferol (VITAMIN D) 2000 UNITS tablet Take 2,000 Units by mouth daily.    Historical Provider, MD   Cinnamon 500 MG TABS Take 1 tablet by mouth daily.    Historical Provider, MD  ferrous sulfate dried (SLOW FE) 160 (50 FE) MG TBCR SR tablet Take 320 mg by mouth daily.    Historical Provider, MD  fish oil-omega-3 fatty acids 1000 MG capsule Take 2 g by mouth daily.      Historical Provider, MD  ipratropium (ATROVENT) 0.06 % nasal spray Place 2 sprays into both nostrils 4 (four) times daily. 05/26/14   Gregor Hams, MD  levonorgestrel (MIRENA) 20 MCG/24HR IUD 1 each by Intrauterine route once.    Historical Provider, MD  loperamide (IMODIUM) 2 MG capsule Take 1 capsule (2 mg total) by mouth 4 (four) times daily as needed for diarrhea or loose stools. 02/15/15   Audelia Hives Syria Kestner, PA  Melatonin 1 MG TABS Take 1 tablet by mouth daily as needed (sleep).    Historical Provider, MD  Multiple Vitamin (MULTIVITAMIN) capsule Take 1 capsule by mouth daily.      Historical Provider, MD  ondansetron (ZOFRAN) 4 MG tablet Take 1 tablet (4 mg total) by mouth every 8 (eight) hours as needed for nausea or vomiting. 02/15/15   Lutricia Feil, PA  predniSONE (DELTASONE) 10 MG tablet Take 3 tablets (30 mg total) by mouth daily. 05/26/14   Gregor Hams, MD  vitamin B-12 (CYANOCOBALAMIN) 100 MCG tablet Take 100 mcg by mouth daily.    Historical Provider, MD   BP 111/60 mmHg  Pulse 55  Temp(Src) 98.6 F (37 C) (Oral)  Resp 18  SpO2 100% Physical Exam  Constitutional: She is oriented to person, place, and time. She appears well-developed and well-nourished. No distress.  HENT:  Head: Normocephalic and atraumatic.  Mouth/Throat: Oropharynx is clear and moist.  Eyes: Conjunctivae are normal. No scleral icterus.  Neck: Normal range of motion. Neck supple.  Cardiovascular: Normal rate, regular rhythm and normal heart sounds.   Pulmonary/Chest: Effort normal and breath sounds normal.  Abdominal: Soft. Bowel sounds are normal. She exhibits no distension and no mass. There is no tenderness. There is no rebound  and no guarding.  Musculoskeletal: Normal range of motion.  Neurological: She is alert and oriented to person, place, and time.  Skin: Skin is warm and dry.  Psychiatric: She has a normal mood and affect. Her behavior is normal.  Nursing note and vitals reviewed.   ED Course  Procedures (including critical care time) Labs Review Labs Reviewed - No data to display  Imaging Review No results found.   MDM   1. Gastroenteritis/enteritis   Suspect self limited viral enteritis. Zofran and Imodium as directed and follow up if no improvement over next 3-4 days.     Lutricia Feil, [place] 02/15/15 1054

## 2015-02-15 NOTE — ED Notes (Signed)
Pt   Reports  Symptoms    Of  Nausea   Diarrhea       Feels  wek     With   Symptoms  X  2   Days       Pt  Also reports  A  Headache   Which is  Not  releived  By  OTC   meds       Pt  denys  Any  Vomiting  Pt   Is  Sitting  Upright on  The  Exam table  Speaking in  Complete  sentances

## 2015-02-28 ENCOUNTER — Encounter: Payer: Self-pay | Admitting: *Deleted

## 2016-03-16 ENCOUNTER — Ambulatory Visit: Payer: 59 | Admitting: Obstetrics & Gynecology

## 2017-01-11 ENCOUNTER — Ambulatory Visit (INDEPENDENT_AMBULATORY_CARE_PROVIDER_SITE_OTHER): Payer: PRIVATE HEALTH INSURANCE | Admitting: Internal Medicine

## 2017-01-11 VITALS — BP 102/66 | HR 66 | Temp 98.1°F | Ht 59.5 in | Wt 136.7 lb

## 2017-01-11 DIAGNOSIS — J069 Acute upper respiratory infection, unspecified: Secondary | ICD-10-CM

## 2017-01-11 MED ORDER — CETIRIZINE HCL 10 MG PO TABS: 10.0000 mg | ORAL_TABLET | Freq: Every day | ORAL | 0 refills | Status: DC

## 2017-01-11 MED ORDER — FLUTICASONE PROPIONATE 50 MCG/ACT NA SUSP: 2.0000 | Freq: Every day | NASAL | 2 refills | Status: DC

## 2017-01-11 MED ORDER — GUAIFENESIN-DM 100-10 MG/5ML PO SYRP: 5.0000 mL | ORAL_SOLUTION | ORAL | 0 refills | Status: DC | PRN

## 2017-01-11 NOTE — Patient Instructions (Addendum)
You have a viral infection of your sinuses and upper respiratory tract. Unfortunately, we do not have any good antibiotics to treat this infection. However, it should resolve on its own in 10-14 days.  For your symptoms I recommend several treatments:  1. Take a daily antihistamine such as Zyrtec (cetirizine)  2. Cough medication Robutussin (dextromethorphan-guaifenesin) up for every 4-6 hours as needed  3. Sinus irrigation with salt water/saline  4. Intranasal steroid spray Flonase (fluticasone) 2 sprays per nostril once daily   In the meantime, there are several good remedies that will treat your symptoms and hopefully help to make you more comfortable. Additionally, it will be very important to use nasal irrigation and nasal steroid spray to clear you sinus congestion. I have provided a recipe and instructions below.   BUFFERED ISOTONIC SALINE NASAL IRRIGATION  The Benefits:  1. When you irrigate, the isotonic saline (salt water) acts as a solvent and washes the mucus crusts and other debris from your nose.  2. This decongests and improves the airflow into your nose. The sinus passages begin to open.  3. Studies have also shown that a salt water and an alkaline (baking soda) irrigation solution improves nasal membrane cell function (mucociliary flow of mucus debris).  The Recipe:  1. Choose a 1-quart glass jar that is thoroughly cleansed.  2. Fill with sterile or distilled water, or you can boil water from the tap.  3. Add 1 to 2 heaping teaspoons of "pickling/canning/sea" salt (NOT table salt as it contains a large number of additives). This salt is available at the grocery store in the food canning section.  4. Mix ingredients together and store at room temperature. Discard after one week. If you find this solution too strong, you may decrease the amount of salt added to 1 to 1  teaspoons. With children it is often best to start with a milder solution and advance slowly.  Irrigate with 240 ml (8 oz) twice daily.  The Instructions:  You should plan to irrigate your nose with buffered isotonic saline 2 times per day. Many people prefer to warm the solution slightly in the microwave - but be sure that the solution is NOT HOT. Stand over the sink (some do this in the shower) and squirt the solution into each side of your nose, keeping your mouth open. This allows you to spit the saltwater out of your mouth. It will not harm you if you swallow a little.  If you have been told to use a nasal steroid such as Flonase, Nasonex, or Nasacort, you should always use isortonic saline solution first, then use your nasal steroid product. The nasal steroid is much more effective when sprayed onto clean nasal membranes and the steroid medicine will reach deeper into the nose.  Most people experience a little burning sensation the first few times they use a isotonic saline solution, but this usually goes away within a few days.

## 2017-01-11 NOTE — Progress Notes (Signed)
   CC: Cough, congestion, sore throat  HPI:  Jaime [name] is a 41 y.o. woman without a significant medical history presenting with a 3 day history of worsening cough, congestion, nausea, and also diarrhea starting today. She has a frequent cough that is productive of clear sputum. She does not have chest pain or shortness of breath with these coughing episodes. Her symptoms have somewhat increased since their onset. She also has a headache but denies specific sinus pain. Diarrhea starting today consisted of loose normal colored bowel movements without significant abdominal pain. She has not tried taking any medication for symptoms so far.  See problem based assessment and plan below for additional details  No past medical history on file.  Review of Systems:  Review of Systems  Constitutional: Negative for fever.  HENT: Positive for congestion and sore throat. Negative for hearing loss and sinus pain.   Eyes: Negative for blurred vision.  Respiratory: Positive for cough and sputum production. Negative for shortness of breath.   Cardiovascular: Negative for chest pain.  Gastrointestinal: Positive for diarrhea. Negative for abdominal pain.  Skin: Negative for rash.  Neurological: Positive for headaches.    Physical Exam: Physical Exam  Constitutional: She is well-developed, well-nourished, and in no distress.  HENT:  Mouth/Throat: No oropharyngeal exudate.  Eyes: Conjunctivae are normal.  Neck:  Mild tenderness to palpation without any palpable lymphadenopathy  Cardiovascular: Normal rate and regular rhythm.   Pulmonary/Chest: Effort normal and breath sounds normal.  Musculoskeletal: She exhibits no edema.  Skin: Skin is warm and dry. No rash noted.    Vitals:   01/11/17 1006  BP: 102/66  Pulse: 66  Temp: 98.1 F (36.7 C)  TempSrc: Oral  SpO2: 98%  Weight: 136 lb 11.2 oz (62 kg)  Height: 4' 11.5" (1.511 m)    Assessment & Plan:   See Encounters Tab for problem  based charting.  Patient discussed with Dr. Evette Doffing

## 2017-01-14 NOTE — Progress Notes (Signed)
Internal Medicine Clinic Attending  Case discussed with Dr. Rice at the time of the visit.  We reviewed the resident's history and exam and pertinent patient test results.  I agree with the assessment, diagnosis, and plan of care documented in the resident's note.  

## 2017-01-14 NOTE — Assessment & Plan Note (Addendum)
Her symptoms appear are most consistent with an upper respiratory infection. Her diarrhea would be very characteristic of  Viral URI. She has a benign medical history and is not in significant distress from her symptoms. She is keeping down adequate fluids and not vomiting. This should be a self limiting process so I recommend symptomatic management. I agree with her plan of staying home from work today and planning to return Monday if symptoms are controlled.  Work note provided for today Start Cetirizine 10mg  daily Instructed her about and strongly encouraged sinus irrigation Prescribed Flonase Prescribed dextromethorphan-guaifenesin Instructed to call back next week if symptoms are not getting any better

## 2017-02-04 ENCOUNTER — Ambulatory Visit (INDEPENDENT_AMBULATORY_CARE_PROVIDER_SITE_OTHER): Payer: PRIVATE HEALTH INSURANCE | Admitting: Obstetrics & Gynecology

## 2017-02-04 VITALS — BP 115/80 | HR 72 | Ht 60.0 in | Wt 133.4 lb

## 2017-02-04 DIAGNOSIS — Z3009 Encounter for other general counseling and advice on contraception: Secondary | ICD-10-CM

## 2017-02-04 DIAGNOSIS — Z01419 Encounter for gynecological examination (general) (routine) without abnormal findings: Secondary | ICD-10-CM | POA: Diagnosis not present

## 2017-02-04 DIAGNOSIS — Z0000 Encounter for general adult medical examination without abnormal findings: Secondary | ICD-10-CM

## 2017-02-04 DIAGNOSIS — Z124 Encounter for screening for malignant neoplasm of cervix: Secondary | ICD-10-CM

## 2017-02-04 DIAGNOSIS — Z1239 Encounter for other screening for malignant neoplasm of breast: Secondary | ICD-10-CM

## 2017-02-04 NOTE — Progress Notes (Signed)
Subjective:     Jaime [name] is a 41 y.o. female here for a routine exam.  G2P2 s/p SVDx1 and c-section x1. LMP amenorrhea on LnIUD.  Prev menses 12 years prev. Current complaints: none.  S/p marsupialization in 2011   Gynecologic History No LMP recorded. Patient is not currently having periods (Reason: IUD). Contraception: IUD Last Pap: 10/2010. Results were: normal Last mammogram: n/a. Results were: normal  Obstetric History OB History  Gravida Para Term Preterm AB Living  2 2   2   2   SAB TAB Ectopic Multiple Live Births               # Outcome Date GA Lbr Len/2nd Weight Sex Delivery Anes PTL Lv  2 Preterm           1 Preterm                The following portions of the patient's history were reviewed and updated as appropriate: allergies, current medications, past family history, past medical history, past social history, past surgical history and problem list.  Review of Systems Pertinent items are noted in HPI.    Objective:  BP 115/80   Pulse 72   Ht 5' (1.524 m)   Wt 133 lb 6.4 oz (60.5 kg)   BMI 26.05 kg/m  General Appearance:    Alert, cooperative, no distress, appears stated age  Head:    Normocephalic, without obvious abnormality, atraumatic  Eyes:    conjunctiva/corneas clear, EOM's intact, both eyes  Ears:    Normal external ear canals, both ears  Nose:   Nares normal, septum midline, mucosa normal, no drainage    or sinus tenderness  Throat:   Lips, mucosa, and tongue normal; teeth and gums normal  Neck:   Supple, symmetrical, trachea midline, no adenopathy;    thyroid:  no enlargement/tenderness/nodules  Back:     Symmetric, no curvature, ROM normal, no CVA tenderness  Lungs:     Clear to auscultation bilaterally, respirations unlabored  Chest Wall:    No tenderness or deformity   Heart:    Regular rate and rhythm, S1 and S2 normal, no murmur, rub   or gallop  Breast Exam:    No tenderness, masses, or nipple abnormality  Abdomen:     Soft,  non-tender, bowel sounds active all four quadrants,    no masses, no organomegaly  Genitalia:    Normal female without lesion, discharge or tenderness; IUD strings noted at cervix     Extremities:   Extremities normal, atraumatic, no cyanosis or edema  Pulses:   2+ and symmetric all extremities  Skin:   Skin color, texture, turgor normal, no rashes or lesions    Assessment:    Healthy female exam. Normal exam contraception counseling pt wants to repeat IUD in 06/2017 when it is 5 years mature  Will be 41 year in 1 months- desires breat cancer screening at that time    Plan:    Mammogram ordered.    F/u OB:[id number] for replacement of LnIUD F/u PAP and cx F/u in 1 year for annual or sooner prn  Jaime Maselli L. [name], M.D., [place]. [name], M.D., Jaime [name]

## 2017-02-06 LAB — CYTOLOGY - PAP
DIAGNOSIS: NEGATIVE
HPV (WINDOPATH): NOT DETECTED

## 2017-02-25 ENCOUNTER — Encounter: Payer: Self-pay | Admitting: Internal Medicine

## 2017-02-25 ENCOUNTER — Ambulatory Visit (INDEPENDENT_AMBULATORY_CARE_PROVIDER_SITE_OTHER): Payer: PRIVATE HEALTH INSURANCE | Admitting: Internal Medicine

## 2017-02-25 VITALS — BP 100/53 | HR 61 | Temp 98.2°F | Ht 60.0 in | Wt 135.7 lb

## 2017-02-25 DIAGNOSIS — R202 Paresthesia of skin: Secondary | ICD-10-CM

## 2017-02-25 DIAGNOSIS — M545 Low back pain: Secondary | ICD-10-CM

## 2017-02-25 DIAGNOSIS — M79604 Pain in right leg: Secondary | ICD-10-CM

## 2017-02-25 DIAGNOSIS — M5441 Lumbago with sciatica, right side: Secondary | ICD-10-CM

## 2017-02-25 DIAGNOSIS — M549 Dorsalgia, unspecified: Secondary | ICD-10-CM | POA: Insufficient documentation

## 2017-02-25 MED ORDER — NAPROXEN 500 MG PO TABS: 500.0000 mg | ORAL_TABLET | Freq: Two times a day (BID) | ORAL | 2 refills | Status: AC

## 2017-02-25 NOTE — Patient Instructions (Signed)
It was nice meeting you today!  For your leg pain, please take naproxen 500mg  twice a day with meals for about 2 weeks. Continue the hot baths, light stretches to help relieve the inflammation. If you don't see improvement, please come back to the clinic and we will do further workup.   Also, please make an appointment with your PCP for a regular follow up visit.   Sciatica Sciatica is pain, numbness, weakness, or tingling along the path of the sciatic nerve. The sciatic nerve starts in the lower back and runs down the back of each leg. The nerve controls the muscles in the lower leg and in the back of the knee. It also provides feeling (sensation) to the back of the thigh, the lower leg, and the sole of the foot. Sciatica is a symptom of another medical condition that pinches or puts pressure on the sciatic nerve. Generally, sciatica only affects one side of the body. Sciatica usually goes away on its own or with treatment. In some cases, sciatica may keep coming back (recur). What are the causes? This condition is caused by pressure on the sciatic nerve, or pinching of the sciatic nerve. This may be the result of:  A disk in between the bones of the spine (vertebrae) bulging out too far (herniated disk).  Age-related changes in the spinal disks (degenerative disk disease).  A pain disorder that affects a muscle in the buttock (piriformis syndrome).  Extra bone growth (bone spur) near the sciatic nerve.  An injury or break (fracture) of the pelvis.  Pregnancy.  Tumor (rare). What increases the risk? The following factors may make you more likely to develop this condition:  Playing sports that place pressure or stress on the spine, such as football or weight lifting.  Having poor strength and flexibility.  A history of back injury.  A history of back surgery.  Sitting for long periods of time.  Doing activities that involve repetitive bending or lifting.  Obesity. What are  the signs or symptoms? Symptoms can vary from mild to very severe, and they may include:  Any of these problems in the lower back, leg, hip, or buttock:  Mild tingling or dull aches.  Burning sensations.  Sharp pains.  Numbness in the back of the calf or the sole of the foot.  Leg weakness.  Severe back pain that makes movement difficult. These symptoms may get worse when you cough, sneeze, or laugh, or when you sit or stand for long periods of time. Being overweight may also make symptoms worse. In some cases, symptoms may recur over time. How is this diagnosed? This condition may be diagnosed based on:  Your symptoms.  A physical exam. Your health care provider may ask you to do certain movements to check whether those movements trigger your symptoms.  You may have tests, including:  Blood tests.  X-rays.  MRI.  CT scan. How is this treated? In many cases, this condition improves on its own, without any treatment. However, treatment may include:  Reducing or modifying physical activity during periods of pain.  Exercising and stretching to strengthen your abdomen and improve the flexibility of your spine.  Icing and applying heat to the affected area.  Medicines that help:  To relieve pain and swelling.  To relax your muscles.  Injections of medicines that help to relieve pain, irritation, and inflammation around the sciatic nerve (steroids).  Surgery. Follow these instructions at home: Medicines   Take over-the-counter and prescription  medicines only as told by your health care provider.  Do not drive or operate heavy machinery while taking prescription pain medicine. Managing pain   If directed, apply ice to the affected area.  Put ice in a plastic bag.  Place a towel between your skin and the bag.  Leave the ice on for 20 minutes, 2-3 times a day.  After icing, apply heat to the affected area before you exercise or as often as told by your health  care provider. Use the heat source that your health care provider recommends, such as a moist heat pack or a heating pad.  Place a towel between your skin and the heat source.  Leave the heat on for 20-30 minutes.  Remove the heat if your skin turns bright red. This is especially important if you are unable to feel pain, heat, or cold. You may have a greater risk of getting burned. Activity   Return to your normal activities as told by your health care provider. Ask your health care provider what activities are safe for you.  Avoid activities that make your symptoms worse.  Take brief periods of rest throughout the day. Resting in a lying or standing position is usually better than sitting to rest.  When you rest for longer periods, mix in some mild activity or stretching between periods of rest. This will help to prevent stiffness and pain.  Avoid sitting for long periods of time without moving. Get up and move around at least one time each hour.  Exercise and stretch regularly, as told by your health care provider.  Do not lift anything that is heavier than 10 lb (4.5 kg) while you have symptoms of sciatica. When you do not have symptoms, you should still avoid heavy lifting, especially repetitive heavy lifting.  When you lift objects, always use proper lifting technique, which includes:  Bending your knees.  Keeping the load close to your body.  Avoiding twisting. General instructions   Use good posture.  Avoid leaning forward while sitting.  Avoid hunching over while standing.  Maintain a healthy weight. Excess weight puts extra stress on your back and makes it difficult to maintain good posture.  Wear supportive, comfortable shoes. Avoid wearing high heels.  Avoid sleeping on a mattress that is too soft or too hard. A mattress that is firm enough to support your back when you sleep may help to reduce your pain.  Keep all follow-up visits as told by your health care  provider. This is important. Contact a health care provider if:  You have pain that wakes you up when you are sleeping.  You have pain that gets worse when you lie down.  Your pain is worse than you have experienced in the past.  Your pain lasts longer than 4 weeks.  You experience unexplained weight loss. Get help right away if:  You lose control of your bowel or bladder (incontinence).  You have:  Weakness in your lower back, pelvis, buttocks, or legs that gets worse.  Redness or swelling of your back.  A burning sensation when you urinate. This information is not intended to replace advice given to you by your health care provider. Make sure you discuss any questions you have with your health care provider. Document Released: 12/04/2001 Document Revised: 05/15/2016 Document Reviewed: 08/19/2015 Elsevier Interactive Patient Education  2017 [employer].

## 2017-02-25 NOTE — Progress Notes (Signed)
   CC: Right leg pain  HPI:  [name] is a 41 y.o. with a PMH of unremarkable for chronic illnesses presenting to clinic with acute right leg pain.  Patient states that she was walking at work last Thursday and noticed progressively worsening pain in her right leg that began in her inguinal region and radiating down. Patient states that the patient has been getting progressively worse she has not noticed it in her right buttocks as well. Patient endorses some intermittent numbness and tingling down the front of her leg radiating down to her sole. She denies weakness in her lower extremities, saddle paresthesia, bowel or bladder incontinence. She denies injury to the area. She is usually pretty active and actively participates in yoga and using treadmill. At home she has tried Epsom salt baths which have helped with the pain; she also took some ibuprofen prior to this visit which has significantly improved her pain.  Please see problem based Assessment and Plan for status of patients chronic conditions.  No past medical history on file.  Review of Systems:   Review of Systems  Constitutional: Negative for chills and fever.  Gastrointestinal:       No bowel incontinence  Genitourinary:       No bladder incontinence  Musculoskeletal: Negative for back pain, falls and joint pain.       Right buttock pain with radiation into anterior thigh  Neurological: Positive for sensory change (parasthesia in right leg, intermittent). Negative for tingling, focal weakness and weakness.    Physical Exam:  Vitals:   02/25/17 1456  BP: (!) 100/53  Pulse: 61  Temp: 98.2 F (36.8 C)  TempSrc: Oral  SpO2: 100%  Weight: 135 lb 11.2 oz (61.6 kg)  Height: 5' (1.524 m)   Physical Exam  Constitutional: She is oriented to person, place, and time. She appears well-developed and well-nourished. No distress.  HENT:  Head: Normocephalic and atraumatic.  Neck: Normal range of motion.    Cardiovascular: Normal rate, regular rhythm, normal heart sounds and intact distal pulses.  Exam reveals no gallop and no friction rub.   No murmur heard. Pulmonary/Chest: Effort normal and breath sounds normal.  Abdominal: Soft. Bowel sounds are normal. There is no tenderness.  Musculoskeletal:  Tender to palpation of lumbar spine; no other tenderness over bony prominence of right LE.   Pain reproduced with passive and active flexion of R hip as well as internal and external rotation of R hip.   Strength, sensation and DTR intact bilaterally  Neurological: She is alert and oriented to person, place, and time. She displays normal reflexes. No sensory deficit. She exhibits normal muscle tone.  Skin: Skin is warm and dry. Capillary refill takes less than 2 seconds. She is not diaphoretic.    Assessment & Plan:   See Encounters Tab for problem based charting.   Patient discussed with Dr. Abelardo Diesel, MD Internal Medicine PGY1

## 2017-02-26 NOTE — Assessment & Plan Note (Signed)
Patient states that she was walking at work last Thursday and noticed progressively worsening pain in her right leg that began in her inguinal region and radiating down. Patient states that the patient has been getting progressively worse she has not noticed it in her right buttocks as well. Patient endorses some intermittent numbness and tingling down the front of her leg radiating down to her sole. She denies weakness in her lower extremities, saddle paresthesia, bowel or bladder incontinence. She denies injury to the area. She is usually pretty active and actively participates in yoga and using treadmill. At home she has tried Epsom salt baths which have helped with the pain; she also took some ibuprofen prior to this visit which has significantly improved her pain.  Exam reveals point tenderness in lumbar spine, with exacerbation of pain with passive and active range of motion motion at the right hip. Sensation and strength are otherwise intact in the lower extremities bilaterally. Patient likely has mild disc herniation in her lumbar spine due to overactivity. She does not have alarming symptoms at this point. It is reassuring that her symptoms improved significantly with NSAIDs and heat. Patient is in agreement with conservative treatment at this time as well as physical therapy. Next  Plan -Naproxen 500 mg twice a day -Encouraged use of heat and activity as tolerated -Refer to PT -Return to clinic if symptoms are not improving or are worsening -We discussed strict return precautions

## 2017-02-27 ENCOUNTER — Ambulatory Visit: Payer: PRIVATE HEALTH INSURANCE | Admitting: Physical Therapy

## 2017-02-28 NOTE — Progress Notes (Signed)
Internal Medicine Clinic Attending  Case discussed with Dr. Svalina  at the time of the visit.  We reviewed the resident's history and exam and pertinent patient test results.  I agree with the assessment, diagnosis, and plan of care documented in the resident's note.  

## 2017-03-04 ENCOUNTER — Ambulatory Visit: Payer: PRIVATE HEALTH INSURANCE | Admitting: Physical Therapy

## 2017-03-05 ENCOUNTER — Ambulatory Visit: Payer: PRIVATE HEALTH INSURANCE | Attending: Internal Medicine | Admitting: Physical Therapy

## 2017-03-11 NOTE — Addendum Note (Signed)
Addended by: Hulan Fray on: 03/11/2017 07:28 PM   Modules accepted: Orders

## 2017-04-12 ENCOUNTER — Ambulatory Visit: Payer: PRIVATE HEALTH INSURANCE

## 2017-04-16 ENCOUNTER — Other Ambulatory Visit: Payer: Self-pay | Admitting: Obstetrics & Gynecology

## 2017-04-16 DIAGNOSIS — Z1231 Encounter for screening mammogram for malignant neoplasm of breast: Secondary | ICD-10-CM

## 2018-02-22 ENCOUNTER — Emergency Department (HOSPITAL_COMMUNITY): Payer: 59

## 2018-02-22 ENCOUNTER — Encounter (HOSPITAL_COMMUNITY): Payer: Self-pay | Admitting: Emergency Medicine

## 2018-02-22 ENCOUNTER — Emergency Department (HOSPITAL_COMMUNITY)
Admission: EM | Admit: 2018-02-22 | Discharge: 2018-02-22 | Disposition: A | Discharge status: Home / Self Care | Payer: 59 | Attending: Emergency Medicine | Admitting: Emergency Medicine

## 2018-02-22 ENCOUNTER — Other Ambulatory Visit: Payer: Self-pay

## 2018-02-22 DIAGNOSIS — W010XXA Fall on same level from slipping, tripping and stumbling without subsequent striking against object, initial encounter: Secondary | ICD-10-CM | POA: Diagnosis not present

## 2018-02-22 DIAGNOSIS — S90511A Abrasion, right ankle, initial encounter: Secondary | ICD-10-CM | POA: Insufficient documentation

## 2018-02-22 DIAGNOSIS — Y929 Unspecified place or not applicable: Secondary | ICD-10-CM | POA: Diagnosis not present

## 2018-02-22 DIAGNOSIS — Z79899 Other long term (current) drug therapy: Secondary | ICD-10-CM | POA: Insufficient documentation

## 2018-02-22 DIAGNOSIS — S93114A Dislocation of interphalangeal joint of right lesser toe(s), initial encounter: Secondary | ICD-10-CM | POA: Diagnosis not present

## 2018-02-22 DIAGNOSIS — Y939 Activity, unspecified: Secondary | ICD-10-CM | POA: Diagnosis not present

## 2018-02-22 DIAGNOSIS — Y999 Unspecified external cause status: Secondary | ICD-10-CM | POA: Insufficient documentation

## 2018-02-22 DIAGNOSIS — S93104A Unspecified dislocation of right toe(s), initial encounter: Secondary | ICD-10-CM

## 2018-02-22 DIAGNOSIS — S93304A Unspecified dislocation of right foot, initial encounter: Secondary | ICD-10-CM | POA: Diagnosis not present

## 2018-02-22 DIAGNOSIS — S99921A Unspecified injury of right foot, initial encounter: Secondary | ICD-10-CM | POA: Diagnosis present

## 2018-02-22 MED ORDER — IBUPROFEN 400 MG PO TABS
400.0000 mg | ORAL_TABLET | Freq: Once | ORAL | Status: AC | PRN
  Administered 2018-02-22: 400 mg via ORAL
  Filled 2018-02-22: qty 1

## 2018-02-22 MED ORDER — ACETAMINOPHEN 500 MG PO TABS
500.0000 mg | ORAL_TABLET | Freq: Once | ORAL | Status: AC
Start: 2018-02-22 — End: 2018-02-22
  Administered 2018-02-22: 500 mg via ORAL
  Filled 2018-02-22: qty 1

## 2018-02-22 NOTE — ED Triage Notes (Signed)
Pt presents with deformity to R 2nd toe after tripping and falling just PTA; pt denies hitting head or LOC, mechanical fall only

## 2018-02-22 NOTE — ED Provider Notes (Signed)
[facility] EMERGENCY DEPARTMENT Provider Note   CSN: [id number] Arrival date & time: 02/22/18  0049     History   Chief Complaint Chief Complaint  Patient presents with  . Toe Injury    HPI Jaime [name] is a 42 y.o. female.  HPI Patient presents with pain at the distal right foot. Patient tripped, just prior to ED arrival, felt sudden onset of pain, disfigurement of the second toe. Patient did suffer an abrasion to the right lateral ankle, but has no other ankle pain, she also has minor pain in her right distal lateral knee, but no inability to move it, no substantial swelling or pain. No other head trauma, other injuries.  When she was well prior to the event.  History reviewed. No pertinent past medical history.  Patient Active Problem List   Diagnosis Date Noted  . Back pain 02/25/2017  . DANDRUFF 11/14/2010  . XERODERMA 11/14/2010  . SINUSITIS, CHRONIC 12/14/2008  . HEADACHE 12/14/2008  . DIZZINESS 12/07/2008  . BARTHOLIN'S CYST 10/31/2007  . IDIOPATHIC URTICARIA 10/31/2007    Past Surgical History:  Procedure Laterality Date  . BARTHOLIN CYST MARSUPIALIZATION  03/2010  . WISDOM TOOTH EXTRACTION  2011    OB History    Gravida Para Term Preterm AB Living   2 2   2   2    SAB TAB Ectopic Multiple Live Births                   Home Medications    Prior to Admission medications   Medication Sig Start Date End Date Taking? Authorizing Provider  Cholecalciferol (VITAMIN D) 2000 UNITS tablet Take 2,000 Units by mouth daily.    [provider]  Cinnamon 500 MG TABS Take 1 tablet by mouth daily.    [provider]  ferrous sulfate dried (SLOW FE) 160 (50 FE) MG TBCR SR tablet Take 320 mg by mouth daily.    [provider]  levonorgestrel (MIRENA) 20 MCG/24HR IUD 1 each by Intrauterine route once.    [provider]  Melatonin 1 MG TABS Take 1 tablet by mouth daily as needed (sleep).    [provider]  Multiple Vitamin (MULTIVITAMIN) capsule Take 1 capsule by mouth daily.      [provider]  naproxen (NAPROSYN) 500 MG tablet Take 1 tablet (500 mg total) by mouth 2 (two) times daily with a meal. 02/25/17 02/25/18  Alphonzo Grieve, MD  vitamin B-12 (CYANOCOBALAMIN) 100 MCG tablet Take 100 mcg by mouth daily.    [provider]    Family History History reviewed. No pertinent family history.  Social History Social History   Tobacco Use  . Smoking status: Never Smoker  Substance Use Topics  . Alcohol use: No  . Drug use: No     Allergies   Patient has no known allergies.   Review of Systems Review of Systems  Constitutional: Negative for fever.  Respiratory: Negative for shortness of breath.   Cardiovascular: Negative for chest pain.  Musculoskeletal:       Negative aside from HPI  Skin: Positive for wound.  Allergic/Immunologic: Negative for immunocompromised state.  Neurological: Negative for weakness.     Physical Exam Updated Vital Signs BP (!) 92/46 (BP Location: Right Arm)   Pulse 61   Temp 97.6 F (36.4 C) (Oral)   Ht 5' (1.524 m)   Wt 53.5 kg (118 lb)   SpO2 100%   BMI 23.05 kg/m  Physical Exam  Constitutional: She appears well-developed and well-nourished.  HENT:  Head: Normocephalic and atraumatic.  Eyes: Conjunctivae are normal.  Cardiovascular: Normal rate, regular rhythm and intact distal pulses.  Pulmonary/Chest: Effort normal. No respiratory distress.  Musculoskeletal: She exhibits deformity.  Lateral deviation of the right second toe at the PIP Left second toe with minor abrasion on the dorsal surface, no active bleeding Abrasion to the right lateral ankle, no tenderness, no swelling, no disfigurement.  Skin:  Minor abrasion, right ankle as above  Psychiatric: She has a normal mood and affect.  Nursing note and vitals reviewed.    ED Treatments / Results   Radiology Dg Foot Complete Right  Result  Date: 02/22/2018 CLINICAL DATA:  Right second toe to injury due to a trip and fall today. Initial encounter. EXAM: RIGHT FOOT COMPLETE - 3+ VIEW COMPARISON:  None. FINDINGS: The PIP joint of the right second toe. No fracture is laterally dislocated is identified. The exam is otherwise negative. IMPRESSION: Lateral dislocation PIP joint right second toe. Electronically Signed   By: Inge Rise M.D.   On: 02/22/2018 01:29    Procedures Reduction of dislocation Date/Time: 02/22/2018 1:53 AM Performed by: Carmin Muskrat, MD Authorized by: Carmin Muskrat, MD  Consent: The procedure was performed in an emergent situation. Verbal consent obtained. Risks and benefits: risks, benefits and alternatives were discussed Consent given by: patient Patient understanding: patient states understanding of the procedure being performed Patient consent: the patient's understanding of the procedure matches consent given Procedure consent: procedure consent matches procedure scheduled Relevant documents: relevant documents present and verified Test results: test results available and properly labeled Site marked: the operative site was marked Imaging studies: imaging studies available Required items: required blood products, implants, devices, and special equipment available Patient identity confirmed: verbally with patient Time out: Immediately prior to procedure a "time out" was called to verify the correct patient, procedure, equipment, support staff and site/side marked as required. Preparation: Patient was prepped and draped in the usual sterile fashion. Local anesthesia used: no  Anesthesia: Local anesthesia used: no Patient tolerance: Patient tolerated the procedure well with no immediate complications Comments: Reduction of the dislocation of the right second toe successfully accomplished, with no complications, confirmed via x-ray.    (including critical care time)  Medications Ordered in  ED Medications  acetaminophen (TYLENOL) tablet 500 mg (not administered)  ibuprofen (ADVIL,MOTRIN) tablet 400 mg (400 mg Oral Given 02/22/18 0137)     Initial Impression / Assessment and Plan / ED Course  I have reviewed the triage vital signs and the nursing notes.  Pertinent labs & imaging results that were available during my care of the patient were reviewed by me and considered in my medical decision making (see chart for details).    Vesely well female presents after sustaining a dislocation other minor injuries. Patient is awake, alert, in no distress, distally neurovascularly intact. Patient had successful reduction of her toe, was discharged in stable condition after the toe was taped to the adjacent digit.  Final Clinical Impressions(s) / ED Diagnoses  Fall, initial encounter, dislocation, right second toe, initial encounter   Carmin Muskrat, MD 02/22/18 [phone]

## 2018-04-30 ENCOUNTER — Other Ambulatory Visit: Payer: Self-pay | Admitting: Internal Medicine

## 2018-05-06 ENCOUNTER — Ambulatory Visit: Payer: 59 | Admitting: Internal Medicine

## 2018-05-06 ENCOUNTER — Encounter: Payer: Self-pay | Admitting: Internal Medicine

## 2018-08-18 ENCOUNTER — Encounter (HOSPITAL_COMMUNITY): Payer: Self-pay | Admitting: Emergency Medicine

## 2018-08-18 ENCOUNTER — Ambulatory Visit (INDEPENDENT_AMBULATORY_CARE_PROVIDER_SITE_OTHER): Payer: 59

## 2018-08-18 ENCOUNTER — Ambulatory Visit (HOSPITAL_COMMUNITY)
Admission: EM | Admit: 2018-08-18 | Discharge: 2018-08-18 | Disposition: A | Discharge status: Home / Self Care | Payer: 59 | Attending: Family Medicine | Admitting: Family Medicine

## 2018-08-18 DIAGNOSIS — M542 Cervicalgia: Secondary | ICD-10-CM

## 2018-08-18 DIAGNOSIS — M545 Low back pain, unspecified: Secondary | ICD-10-CM

## 2018-08-18 DIAGNOSIS — S3992XA Unspecified injury of lower back, initial encounter: Secondary | ICD-10-CM | POA: Diagnosis not present

## 2018-08-18 DIAGNOSIS — S199XXA Unspecified injury of neck, initial encounter: Secondary | ICD-10-CM | POA: Diagnosis not present

## 2018-08-18 MED ORDER — IBUPROFEN 800 MG PO TABS: 800.0000 mg | ORAL_TABLET | Freq: Three times a day (TID) | ORAL | 0 refills | Status: DC

## 2018-08-18 MED ORDER — CYCLOBENZAPRINE HCL 5 MG PO TABS: 5.0000 mg | ORAL_TABLET | Freq: Three times a day (TID) | ORAL | 0 refills | Status: DC | PRN
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
## 2018-08-18 NOTE — Discharge Instructions (Signed)
Activity as tolerated Use ice or heat to painful muscles Take ibuprofen 3 times a day with food.  This is an anti-inflammatory pain medication. Take cyclobenzaprine as needed, muscle relaxer.  This is useful at bedtime.  Cautioned taking it during the day. Follow-up with your primary care doctor if not improved a few days
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
## 2018-10-15 DIAGNOSIS — Z136 Encounter for screening for cardiovascular disorders: Secondary | ICD-10-CM | POA: Diagnosis not present

## 2018-10-15 DIAGNOSIS — Z0000 Encounter for general adult medical examination without abnormal findings: Secondary | ICD-10-CM | POA: Diagnosis not present

## 2018-10-15 DIAGNOSIS — R635 Abnormal weight gain: Secondary | ICD-10-CM | POA: Diagnosis not present

## 2018-11-14 DIAGNOSIS — B351 Tinea unguium: Secondary | ICD-10-CM | POA: Diagnosis not present

## 2018-11-14 DIAGNOSIS — Z79899 Other long term (current) drug therapy: Secondary | ICD-10-CM | POA: Diagnosis not present

## 2019-03-16 ENCOUNTER — Other Ambulatory Visit: Payer: Self-pay

## 2019-03-16 ENCOUNTER — Emergency Department (HOSPITAL_COMMUNITY)
Admission: EM | Admit: 2019-03-16 | Discharge: 2019-03-17 | Disposition: A | Discharge status: Home / Self Care | Payer: 59 | Attending: Emergency Medicine | Admitting: Emergency Medicine

## 2019-03-16 DIAGNOSIS — R110 Nausea: Secondary | ICD-10-CM | POA: Diagnosis not present

## 2019-03-16 DIAGNOSIS — R202 Paresthesia of skin: Secondary | ICD-10-CM | POA: Diagnosis not present

## 2019-03-16 DIAGNOSIS — R51 Headache: Secondary | ICD-10-CM

## 2019-03-16 DIAGNOSIS — G43909 Migraine, unspecified, not intractable, without status migrainosus: Secondary | ICD-10-CM | POA: Diagnosis not present

## 2019-03-16 DIAGNOSIS — R200 Anesthesia of skin: Secondary | ICD-10-CM | POA: Insufficient documentation

## 2019-03-16 DIAGNOSIS — R519 Headache, unspecified: Secondary | ICD-10-CM

## 2019-03-16 NOTE — ED Triage Notes (Addendum)
Took a BC powder earlier for an migraine shortly after began to have numbness on left side of face and throat became sore. Also was having palpitations.

## 2019-03-17 ENCOUNTER — Emergency Department (HOSPITAL_COMMUNITY): Payer: 59

## 2019-03-17 DIAGNOSIS — R200 Anesthesia of skin: Secondary | ICD-10-CM | POA: Diagnosis not present

## 2019-03-17 LAB — CBC
HEMATOCRIT: 39.6 % (ref 36.0–46.0)
HEMOGLOBIN: 12.9 g/dL (ref 12.0–15.0)
MCH: 30 pg (ref 26.0–34.0)
MCHC: 32.6 g/dL (ref 30.0–36.0)
MCV: 92.1 fL (ref 80.0–100.0)
Platelets: 265 10*3/uL (ref 150–400)
RBC: 4.3 MIL/uL (ref 3.87–5.11)
RDW: 12.4 % (ref 11.5–15.5)
WBC: 9.2 10*3/uL (ref 4.0–10.5)
nRBC: 0 % (ref 0.0–0.2)

## 2019-03-17 LAB — COMPREHENSIVE METABOLIC PANEL
ALK PHOS: 57 U/L (ref 38–126)
ALT: 13 U/L (ref 0–44)
AST: 20 U/L (ref 15–41)
Albumin: 4.1 g/dL (ref 3.5–5.0)
Anion gap: 9 (ref 5–15)
BUN: 19 mg/dL (ref 6–20)
CHLORIDE: 101 mmol/L (ref 98–111)
CO2: 25 mmol/L (ref 22–32)
CREATININE: 0.84 mg/dL (ref 0.44–1.00)
Calcium: 9.1 mg/dL (ref 8.9–10.3)
GFR calc Af Amer: >60 mL/min (ref >60)
Glucose, Bld: 96 mg/dL (ref 70–99)
Potassium: 4.5 mmol/L (ref 3.5–5.1)
Sodium: 135 mmol/L (ref 135–145)
Total Bilirubin: 0.5 mg/dL (ref 0.3–1.2)
Total Protein: 7.2 g/dL (ref 6.5–8.1)

## 2019-03-17 LAB — I-STAT CREATININE, ED: CREATININE: 0.9 mg/dL (ref 0.44–1.00)

## 2019-03-17 LAB — DIFFERENTIAL
ABS IMMATURE GRANULOCYTES: 0.04 10*3/uL (ref 0.00–0.07)
BASOS PCT: 0 %
Basophils Absolute: 0 10*3/uL (ref 0.0–0.1)
Eosinophils Absolute: 0.2 10*3/uL (ref 0.0–0.5)
Eosinophils Relative: 2 %
Immature Granulocytes: 0 %
LYMPHS ABS: 2.6 10*3/uL (ref 0.7–4.0)
LYMPHS PCT: 29 %
MONOS PCT: 8 %
Monocytes Absolute: 0.8 10*3/uL (ref 0.1–1.0)
NEUTROS ABS: 5.6 10*3/uL (ref 1.7–7.7)
NEUTROS PCT: 61 %

## 2019-03-17 LAB — I-STAT BETA HCG BLOOD, ED (MC, WL, AP ONLY): I-stat hCG, quantitative: <5 m[IU]/mL (ref <5)

## 2019-03-17 LAB — PROTIME-INR
INR: 1 (ref 0.8–1.2)
Prothrombin Time: 12.8 seconds (ref 11.4–15.2)

## 2019-03-17 LAB — GROUP A STREP BY PCR: Group A Strep by PCR: NOT DETECTED

## 2019-03-17 LAB — APTT: aPTT: 30 seconds (ref 24–36)

## 2019-03-17 MED ORDER — SODIUM CHLORIDE 0.9% FLUSH: 3.0000 mL | Freq: Once | INTRAVENOUS | Status: DC

## 2019-03-17 MED ORDER — METOCLOPRAMIDE HCL 5 MG/ML IJ SOLN
10.0000 mg | Freq: Once | INTRAMUSCULAR | Status: AC
  Administered 2019-03-17: 10 mg via INTRAVENOUS
  Filled 2019-03-17: qty 2

## 2019-03-17 MED ORDER — GADOBUTROL 1 MMOL/ML IV SOLN
5.5000 mL | Freq: Once | INTRAVENOUS | Status: AC | PRN
  Administered 2019-03-17: 5.5 mL via INTRAVENOUS

## 2019-03-17 MED ORDER — KETOROLAC TROMETHAMINE 15 MG/ML IJ SOLN: 15.0000 mg | Freq: Once | INTRAMUSCULAR | Status: DC

## 2019-03-17 NOTE — Discharge Instructions (Addendum)
We saw in the ER for headaches and numbness. The work-up in the ER that comprised of CT scan and MRI does not reveal any bleeding, stroke, tumors, neuromuscular disease.  At this time we suspect that your headaches are because of possibly migraines and we recommend that you follow-up with a neurologist.  We recommend that you take ibuprofen for your headaches.

## 2019-03-17 NOTE — ED Notes (Signed)
Patient transported to MRI 

## 2019-03-17 NOTE — ED Provider Notes (Signed)
[facility] EMERGENCY DEPARTMENT Provider Note   CSN: [id number] Arrival date & time: 03/16/19  2348    History   Chief Complaint Chief Complaint  Patient presents with  . Numbness    HPI Jaime [name] is a 43 y.o. female.     HPI 43 year old female comes in a chief complaint of numbness. Patient reports that she started having headaches about 2 weeks ago.  She started noticing numbness to her face at the same time.  Her headaches have been fairly constant, but do respond to Chauncey powders.  Patient has been taking at least 2B powders a day for the duration of her headaches.  Her headaches are frontal and present on both sides.  She describes the headaches as throbbing headache with associated nausea.  Patient has been having fairly constant numbness to the left side of her face that she has been ignoring associated with this headache.  This evening she started having numbness in her entire left side including the upper and lower extremities, therefore she called EMS.  Patient denies any chest pain, shortness of breath.  She denies any substance abuse, heavy alcohol use, cigarette smoking, family history of heart attack or stroke.  Patient does indicate that her mother has migraines, however patient does not have history of headaches herself.  Patient Active Problem List   Diagnosis Date Noted  . Back pain 02/25/2017  . [place] CYST 10/31/2007    Past Surgical History:  Procedure Laterality Date  . BARTHOLIN CYST MARSUPIALIZATION  03/2010  . WISDOM TOOTH EXTRACTION  2011     OB History    Gravida  2   Para  2   Term      Preterm  2   AB      Living  2     SAB      TAB      Ectopic      Multiple      Live Births               Home Medications    Prior to Admission medications   Medication Sig Start Date End Date Taking? Authorizing Provider  cyclobenzaprine (FLEXERIL) 5 MG tablet Take 1 tablet (5 mg total) by mouth 3 (three)  times daily as needed for muscle spasms. Patient not taking: Reported on 03/17/2019 08/18/18   Raylene Everts, MD  ibuprofen (ADVIL,MOTRIN) 800 MG tablet Take 1 tablet (800 mg total) by mouth 3 (three) times daily. Patient not taking: Reported on 03/17/2019 08/18/18   Raylene Everts, MD    Family History No family history on file.  Social History Social History   Tobacco Use  . Smoking status: Never Smoker  Substance Use Topics  . Alcohol use: No  . Drug use: No     Allergies   Patient has no known allergies.   Review of Systems Review of Systems  Constitutional: Positive for activity change.  Cardiovascular: Negative for chest pain.  Gastrointestinal: Positive for nausea.  Neurological: Positive for numbness and headaches.  All other systems reviewed and are negative.    Physical Exam Updated Vital Signs BP 99/63   Pulse 66   Temp 99 F (37.2 C) (Oral)   Resp 13   SpO2 100%   Physical Exam Vitals signs and nursing note reviewed.  Constitutional:      Appearance: She is well-developed.  HENT:     Head: Normocephalic and atraumatic.  Neck:     Musculoskeletal:  Normal range of motion and neck supple.  Cardiovascular:     Rate and Rhythm: Normal rate.  Pulmonary:     Effort: Pulmonary effort is normal.  Abdominal:     General: Bowel sounds are normal.  Skin:    General: Skin is warm and dry.  Neurological:     General: No focal deficit present.     Mental Status: She is alert and oriented to person, place, and time.     Cranial Nerves: No cranial nerve deficit.     Comments: Gross sensory numbness on the left upper extremity and left lower extremity.  Patient also has numbness to the left side of her face.  Motor strength is 4+ out of 5 bilateral upper and lower extremity.  Cerebellar exam reveals no dysmetria and patient does not have any nystagmus.      ED Treatments / Results  Labs (all labs ordered are listed, but only abnormal results are  displayed) Labs Reviewed  GROUP A STREP BY PCR  PROTIME-INR  APTT  CBC  DIFFERENTIAL  COMPREHENSIVE METABOLIC PANEL  I-STAT CREATININE, ED  I-STAT BETA HCG BLOOD, ED ([facility], [facility], [facility] ONLY)  CBG MONITORING, ED    EKG EKG Interpretation  Date/Time:  Tuesday March 17 2019 00:03:07 EDT Ventricular Rate:  66 PR Interval:  124 QRS Duration: 72 QT Interval:  382 QTC Calculation: 400 R Axis:   69 Text Interpretation:  Normal sinus rhythm Normal ECG No acute changes Nonspecific ST abnormality No significant change since last tracing Confirmed by Varney Biles [phone]) on 03/17/2019 12:08:02 AM   Radiology Ct Head Wo Contrast  Result Date: 03/17/2019 CLINICAL DATA:  Left-sided facial numbness. Migraine. EXAM: CT HEAD WITHOUT CONTRAST TECHNIQUE: Contiguous axial images were obtained from the base of the skull through the vertex without intravenous contrast. COMPARISON:  None. FINDINGS: Brain: There is no mass, hemorrhage or extra-axial collection. The size and configuration of the ventricles and extra-axial CSF spaces are normal. The brain parenchyma is normal, without acute or chronic infarction. Vascular: No abnormal hyperdensity of the major intracranial arteries or dural venous sinuses. No intracranial atherosclerosis. Skull: The visualized skull base, calvarium and extracranial soft tissues are normal. Sinuses/Orbits: No fluid levels or advanced mucosal thickening of the visualized paranasal sinuses. No mastoid or middle ear effusion. The orbits are normal. IMPRESSION: Normal head CT. Electronically Signed   By: Ulyses Jarred M.D.   On: 03/17/2019 00:30   Mr Brain W And Wo Contrast  Result Date: 03/17/2019 CLINICAL DATA:  Migraine.  Left-sided facial numbness EXAM: MRI HEAD WITHOUT AND WITH CONTRAST TECHNIQUE: Multiplanar, multiecho pulse sequences of the brain and surrounding structures were obtained without and with intravenous contrast. CONTRAST:  5.5 mL Gadavist COMPARISON:  Head CT  03/17/2019 FINDINGS: BRAIN: There is no acute infarct, acute hemorrhage or extra-axial collection. The midline structures are normal. No midline shift or other mass effect. The white matter signal is normal for the patient's age. The cerebral and cerebellar volume are age-appropriate. No hydrocephalus. Susceptibility-sensitive sequences show no chronic microhemorrhage or superficial siderosis. No abnormal contrast enhancement. VASCULAR: The major intracranial arterial and venous sinus flow voids are normal. SKULL AND UPPER CERVICAL SPINE: Calvarial bone marrow signal is normal. There is no skull base mass. Visualized upper cervical spine and soft tissues are normal. SINUSES/ORBITS: No fluid levels or advanced mucosal thickening. No mastoid or middle ear effusion. The orbits are normal. IMPRESSION: Normal brain MRI. Electronically Signed   By: Cletus Gash.D.  On: 03/17/2019 02:52    Procedures Procedures (including critical care time)  Medications Ordered in ED Medications  sodium chloride flush (NS) 0.9 % injection 3 mL (has no administration in time range)  metoCLOPramide (REGLAN) injection 10 mg (10 mg Intravenous Given 03/17/19 0130)  gadobutrol (GADAVIST) 1 MMOL/ML injection 5.5 mL (5.5 mLs Intravenous Contrast Given 03/17/19 0241)     Initial Impression / Assessment and Plan / ED Course  I have reviewed the triage vital signs and the nursing notes.  Pertinent labs & imaging results that were available during my care of the patient were reviewed by me and considered in my medical decision making (see chart for details).       43 year old female comes in a chief complaint of left-sided numbness.  She has been having headaches for the last several days associated with facial numbness on the left side.  Today she started having numbness on the left upper extremity and left lower extremity, and therefore decided come to the ER.  Her neurologic exam is nonfocal besides the subjective  numbness of the left side.  CT scan of the head was ordered at triage and is negative.  Clinical concerns are for complex headache versus venous thrombosis versus MS.  Unlikely to be stroke, but there is also possibility.  I discussed the case with Dr. Lorraine Lax, neurology he recommends that we get MRI with and without contrast.  Final Clinical Impressions(s) / ED Diagnoses   Final diagnoses:  Acute nonintractable headache, unspecified headache type  Numbness  Paresthesia    ED Discharge Orders    None       Varney Biles, MD 03/17/19 [id number]

## 2019-03-17 NOTE — ED Notes (Signed)
Returned from MRI in stable condition , respirations unlabored / denies pain .

## 2019-03-17 NOTE — ED Notes (Signed)
Patient verbalizes understanding of discharge instructions. Opportunity for questioning and answers were provided. Armband removed by staff, pt discharged from ED. Ambulated out to lobby  

## 2020-07-11 IMAGING — MR MRI HEAD WITHOUT AND WITH CONTRAST
14 of 16 series · 40 of 48 positions shown · IV contrast (gadavist)
Comparison: Head CT 03/17/2019

CLINICAL DATA: Migraine.  Left-sided facial numbness

EXAM:
MRI HEAD WITHOUT AND WITH CONTRAST
TECHNIQUE: Multiplanar, multiecho pulse sequences of the brain and surrounding
structures were obtained without and with intravenous contrast.
CONTRAST:  5.5 mL Gadavist

[Series 5: DWI · axial · 3.0mm · 0.88mm/px · z∈[-77,+55]mm · 5 of 90 slices shown (1 of 4)]
[im 1/90]
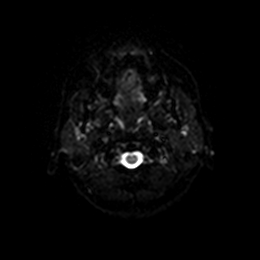
[im 23/90]
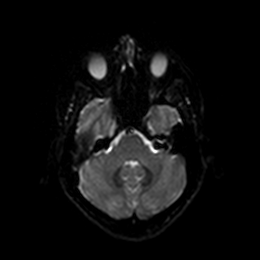
[im 45/90]
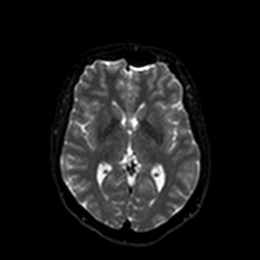
[im 67/90]
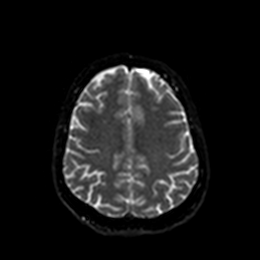
[im 90/90]
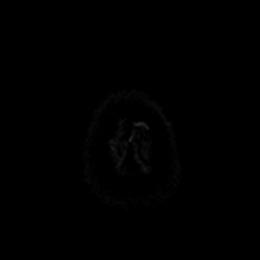

[Series 6: DWI · axial · 3.0mm · 0.88mm/px · z∈[-77,+55]mm · 2 of 45 slices shown (2 of 4)]
[im 1/45]
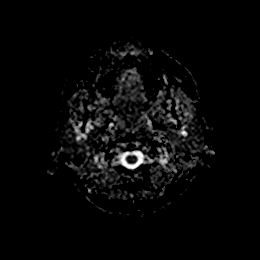
[im 45/45]
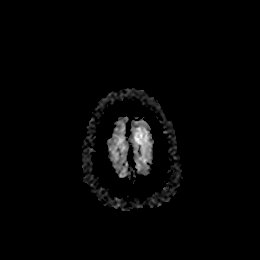

[Series 7: DWI · coronal · 4.0mm · 0.88mm/px · 4 of 64 slices shown (3 of 4)]
[im 1/64]
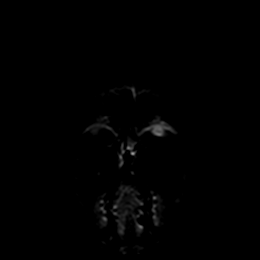
[im 22/64]
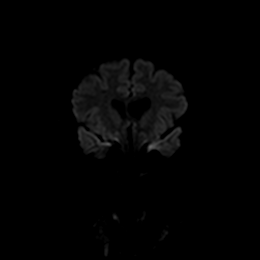
[im 43/64]
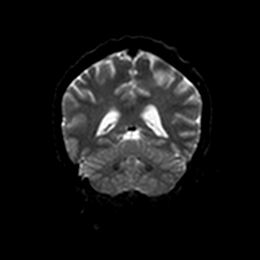
[im 64/64]
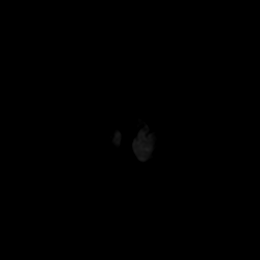

[Series 8: DWI · coronal · 4.0mm · 0.88mm/px · 2 of 32 slices shown (4 of 4)]
[im 1/32]
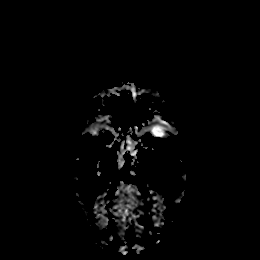
[im 32/32]
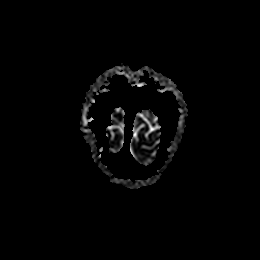

[Series 9: T1 · sagittal · 5.0mm · 0.69mm/px · 2 of 23 slices shown]
[im 1/23]
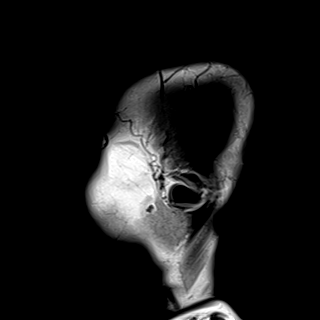
[im 23/23]
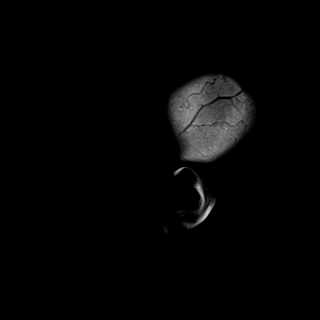

[Series 10: T2 · axial · 5.0mm · 0.69mm/px · z∈[-79,+64]mm · 2 of 25 slices shown]
[im 1/25]
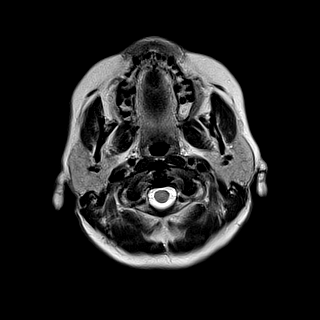
[im 25/25]
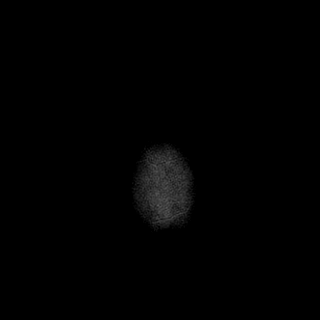

[Series 11: FLAIR · axial · 5.0mm · 0.45mm/px · z∈[-79,+65]mm · 2 of 25 slices shown]
[im 1/25]
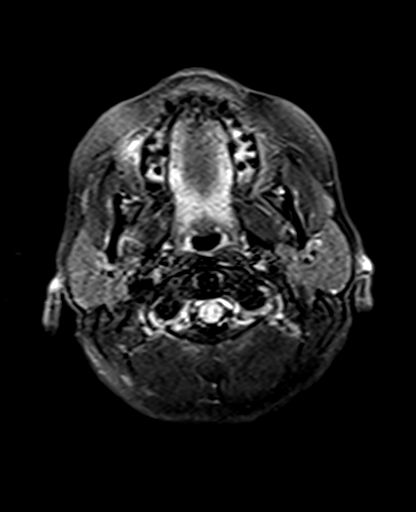
[im 25/25]
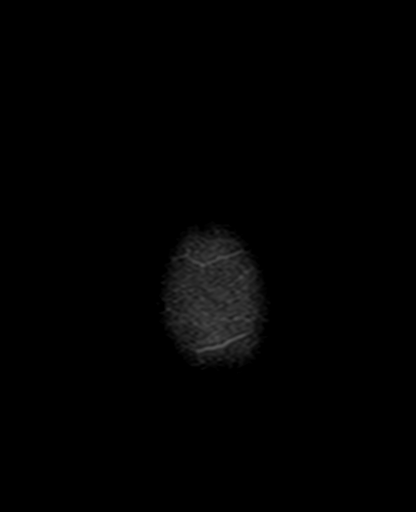

[Series 12: mag_images · axial · 3.0mm · 0.90mm/px · z∈[-103,+72]mm · 4 of 60 slices shown]
[im 1/60]
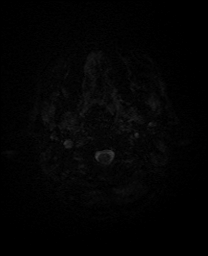
[im 20/60]
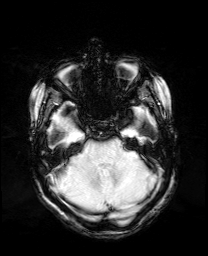
[im 40/60]
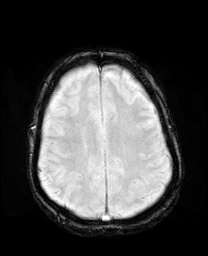
[im 60/60]
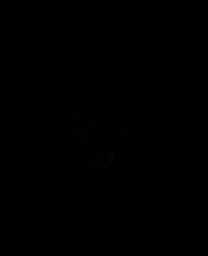

[Series 13: pha_images · axial · 3.0mm · 0.90mm/px · z∈[-103,+63]mm · 4 of 57 slices shown]
[im 1/57]
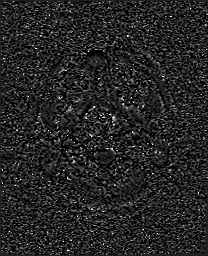
[im 19/57]
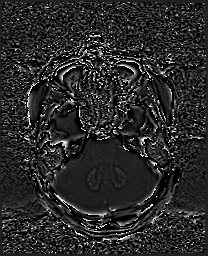
[im 38/57]
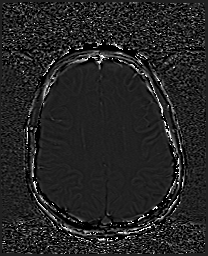
[im 57/57]
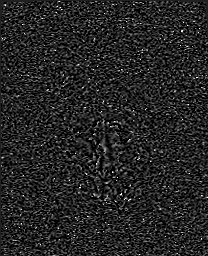

[Series 14: swi_images · axial · 3.0mm · 0.90mm/px · z∈[-103,+72]mm · 4 of 60 slices shown]
[im 1/60]
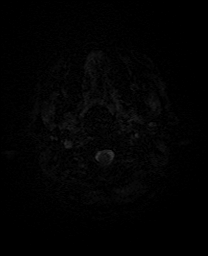
[im 20/60]
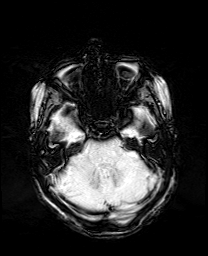
[im 40/60]
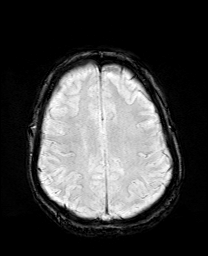
[im 60/60]
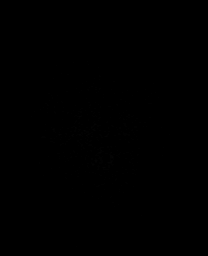

[Series 15: mip_images(sw) · axial · 24.0mm · 0.90mm/px · z∈[-93,+62]mm · 3 of 53 slices shown]
[im 1/53]
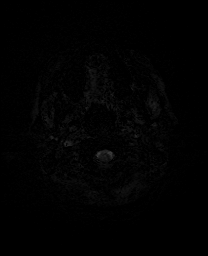
[im 27/53]
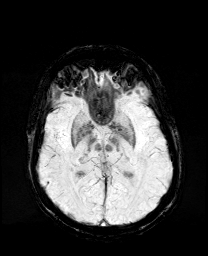
[im 53/53]
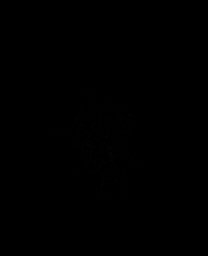

[Series 17: T2 post-contrast · coronal · 5.0mm · 0.72mm/px · 2 of 28 slices shown]
[im 1/28]
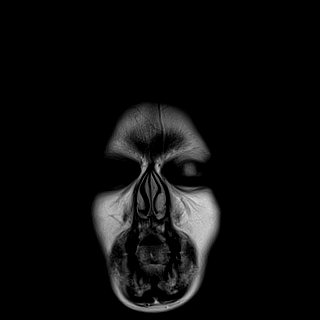
[im 28/28]
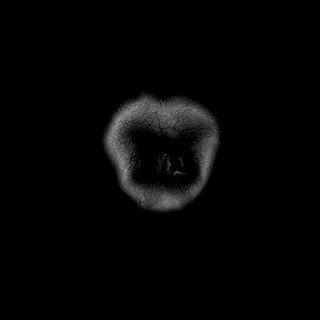

[Series 19: T1 post-contrast · coronal · 5.0mm · 0.34mm/px · 2 of 28 slices shown (1 of 2)]
[im 1/28]
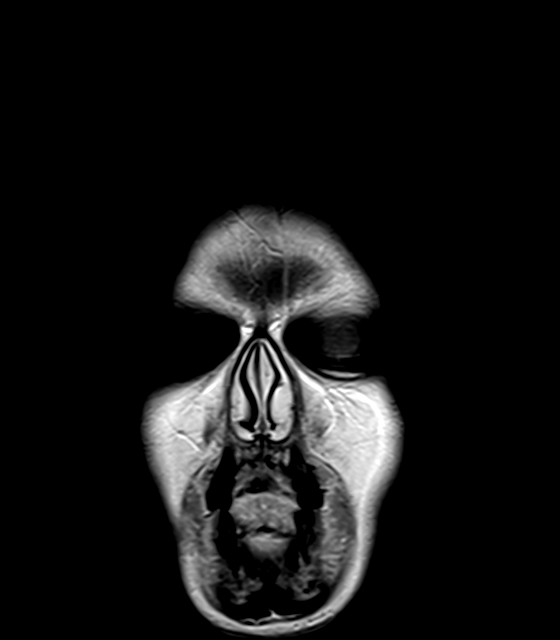
[im 28/28]
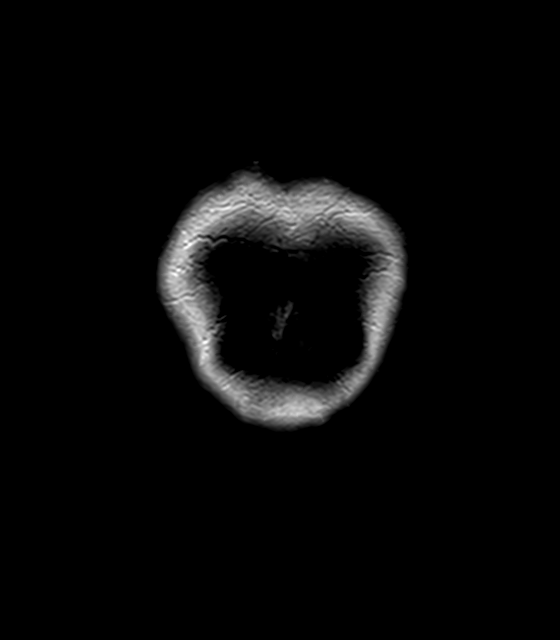

[Series 20: T1 post-contrast · sagittal · 5.0mm · 0.69mm/px · 2 of 23 slices shown (2 of 2)]
[im 1/23]
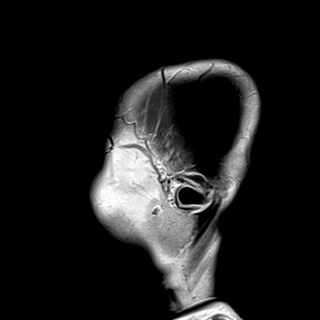
[im 23/23]
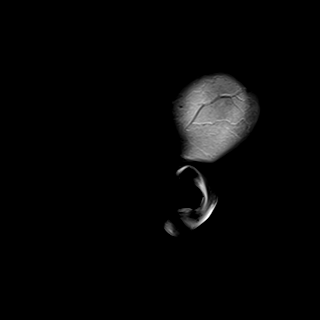

[40 of 48 positions shown; findings below may reference images not displayed]

FINDINGS: BRAIN: There is no acute infarct, acute hemorrhage or extra-axial
collection. The midline structures are normal. No midline shift or
other mass effect. The white matter signal is normal for the
patient's age. The cerebral and cerebellar volume are
age-appropriate. No hydrocephalus. Susceptibility-sensitive
sequences show no chronic microhemorrhage or superficial siderosis.
No abnormal contrast enhancement.

VASCULAR: The major intracranial arterial and venous sinus flow
voids are normal.

SKULL AND UPPER CERVICAL SPINE: Calvarial bone marrow signal is
normal. There is no skull base mass. Visualized upper cervical spine
and soft tissues are normal.

SINUSES/ORBITS: No fluid levels or advanced mucosal thickening. No
mastoid or middle ear effusion. The orbits are normal.
IMPRESSION: Normal brain MRI.

## 2021-04-04 ENCOUNTER — Inpatient Hospital Stay (HOSPITAL_COMMUNITY)
Admission: AD | Admit: 2021-04-04 | Discharge: 2021-04-04 | Disposition: A | Discharge status: Home / Self Care | Payer: 59 | Attending: Obstetrics & Gynecology | Admitting: Obstetrics & Gynecology

## 2021-04-04 ENCOUNTER — Other Ambulatory Visit: Payer: Self-pay

## 2021-04-04 DIAGNOSIS — Z3202 Encounter for pregnancy test, result negative: Secondary | ICD-10-CM

## 2021-04-04 DIAGNOSIS — R109 Unspecified abdominal pain: Secondary | ICD-10-CM | POA: Insufficient documentation

## 2021-04-04 LAB — POCT PREGNANCY, URINE: Preg Test, Ur: NEGATIVE

## 2021-04-04 NOTE — MAU Provider Note (Signed)
Event Date/Time  First Provider Initiated Contact with Patient 04/04/21 [place]      S Ms. Jaime [name] is a 46 y.o. [phone] patient who presents to MAU today with complaint of recurrent abdominal pain. She is also concerned that her IUD is overdue for being removed.  Patient states she does not think she is pregnant and did not intend to seek care in an emergency room. She states [facility] and did not know where to find it. O BP 107/61 (BP Location: Right Arm)   Pulse 60   Temp 98.5 F (36.9 C) (Oral)   Resp 16   Ht 5' (1.524 m)   Wt 56.1 kg   SpO2 98% Comment: room air  BMI 24.16 kg/m    Physical Exam Vitals and nursing note reviewed. Exam conducted with a chaperone present.  Constitutional:      Appearance: She is well-developed. She is not ill-appearing.  Cardiovascular:     Rate and Rhythm: Normal rate.  Pulmonary:     Effort: Pulmonary effort is normal.  Neurological:     Mental Status: She is alert.     A Medical screening exam complete Negative urine pregnancy test  P Discharge from MAU in stable condition Patient given the option of transfer to [facility] for further evaluation or seek care in outpatient facility of choice  List of options for follow-up given Warning signs for worsening condition that would warrant emergency follow-up discussed   Address for [facility] put into patient's phone with help from [place], [place], [place] 04/04/2021 10:26 AM

## 2021-04-04 NOTE — Discharge Instructions (Signed)
Prenatal Care Providers           [facility]  [address] [phone]  [facility] @ [facility]   [address]  [phone]  [facility] @ [facility]       [address] [phone]            [facility] @ [place]     [address] [phone]          [facility] @ [place] [facility]   [address] [phone]  [facility]  [address] [phone]     [facility]  [address]   [phone]     [facility]  Phone: [phone]  [facility]  Phone: [phone]  [facility] Phone: [phone]  [facility] Phone: [phone]  [facility] Phone: [phone]  [facility] Phone: [phone]  [facility]  Phone: [phone]  

## 2021-04-04 NOTE — MAU Note (Signed)
Jaime [name] is a 45 y.o. here in MAU reporting: has been having lower abdominal pain for the past few months and it has been getting worse. Is also having some vaginal bleeding 2-3 times per week. Sees some clotting with the bleeding but states bleeding is not heavy. Pt reports she has not done a pregnancy test at home and does not think she is pregnant.  LMP: has an IUD and states she does not get a period with it in place  Onset of complaint: ongoing  Pain score: 6/10  Vitals:   04/04/21 0929  BP: 107/61  Pulse: 60  Resp: 16  Temp: 98.5 F (36.9 C)  SpO2: 98%     Lab orders placed from triage: UPT

## 2021-05-23 ENCOUNTER — Ambulatory Visit: Payer: 59 | Admitting: Family Medicine

## 2023-04-29 ENCOUNTER — Other Ambulatory Visit: Payer: Self-pay | Admitting: Obstetrics and Gynecology

## 2023-04-29 DIAGNOSIS — R928 Other abnormal and inconclusive findings on diagnostic imaging of breast: Secondary | ICD-10-CM

## 2023-05-06 ENCOUNTER — Ambulatory Visit
Admission: RE | Admit: 2023-05-06 | Discharge: 2023-05-06 | Disposition: A | Discharge status: Home / Self Care | Payer: Managed Care, Other (non HMO) | Source: Ambulatory Visit | Attending: Obstetrics and Gynecology | Admitting: Obstetrics and Gynecology

## 2023-05-06 ENCOUNTER — Ambulatory Visit
Admission: RE | Admit: 2023-05-06 | Discharge: 2023-05-06 | Disposition: A | Discharge status: Home / Self Care | Payer: Self-pay | Source: Ambulatory Visit | Attending: Obstetrics and Gynecology | Admitting: Obstetrics and Gynecology

## 2023-05-06 DIAGNOSIS — R928 Other abnormal and inconclusive findings on diagnostic imaging of breast: Secondary | ICD-10-CM

## 2023-05-28 ENCOUNTER — Encounter: Payer: Self-pay | Admitting: Nurse Practitioner

## 2023-05-28 ENCOUNTER — Ambulatory Visit: Payer: Managed Care, Other (non HMO) | Admitting: Nurse Practitioner

## 2023-05-28 VITALS — BP 115/53 | HR 66 | Ht 59.0 in | Wt 127.6 lb

## 2023-05-28 DIAGNOSIS — B351 Tinea unguium: Secondary | ICD-10-CM | POA: Diagnosis not present

## 2023-05-28 DIAGNOSIS — Z23 Encounter for immunization: Secondary | ICD-10-CM

## 2023-05-28 DIAGNOSIS — L219 Seborrheic dermatitis, unspecified: Secondary | ICD-10-CM | POA: Diagnosis not present

## 2023-05-28 DIAGNOSIS — Z0000 Encounter for general adult medical examination without abnormal findings: Secondary | ICD-10-CM | POA: Insufficient documentation

## 2023-05-28 DIAGNOSIS — R21 Rash and other nonspecific skin eruption: Secondary | ICD-10-CM

## 2023-05-28 MED ORDER — CICLOPIROX 8 % EX SOLN: Freq: Every day | CUTANEOUS | 0 refills | Status: AC

## 2023-05-28 MED ORDER — CICLOPIROX 1 % EX SHAM: MEDICATED_SHAMPOO | CUTANEOUS | 0 refills | Status: AC

## 2023-05-28 NOTE — Assessment & Plan Note (Signed)
-   ciclopirox (PENLAC) 8 % solution; Apply topically at bedtime. Apply over nail and surrounding skin. Apply daily over previous coat. After seven (7) days, may remove with alcohol and continue cycle.  Dispense: 6.6 mL; Refill: 0

## 2023-05-28 NOTE — Progress Notes (Addendum)
Complete physical exam  Patient: Jaime [name]   DOB: 03/27/1976   47 y.o. Female  MRN: [id number]  Subjective:    Chief Complaint  Patient presents with   Employment Physical    Jaime [name] is a 47 y.o. female with no significant PMH who presents today for a complete physical exam. Has no PCP, goes to the OBGYN at  [facility] [place] .she is up-to-date with mammogram and Pap smear.  Due for colon cancer screening colonoscopy was ordered by OB/GYN She reports consuming a general diet. Does weight lifting  for exercises.  She generally feels well. She reports sleeping well.   Patient complains of a rash on her face, multiple rashes on her bilateral upper arms.  States that the rash on her face is getting bigger.  The rashes on her arms comes out at certain times of the year.  The rashes are not itching.  She would like referral to dermatologist for the rash on her face.  Patient complains of nail fungus infection, thickened and discolored nails.  She denies toe nail pain.  Has not used any medication for this  Patient c/o of dandruff on her scalp , she has used OTC hair products without much improvement.   Most recent fall risk assessment:    05/28/2023   11:23 AM  Fall Risk   Falls in the past year? 0  Number falls in past yr: 0  Injury with Fall? 0  Risk for fall due to : No Fall Risks  Follow up Falls evaluation completed     Most recent depression screenings:    05/28/2023   11:24 AM 02/25/2017    3:00 PM  PHQ 2/9 Scores  PHQ - 2 Score 0 0        Patient Care Team: Donell Beers, FNP as PCP - General (Nurse Practitioner)   No outpatient medications prior to visit.   No facility-administered medications prior to visit.    Review of Systems  Constitutional:  Negative for activity change, appetite change, chills, diaphoresis, fatigue and fever.  HENT:  Negative for congestion, dental problem, drooling, ear discharge, ear pain and facial  swelling.   Eyes:  Negative for pain, discharge, redness and itching.  Respiratory:  Negative for cough, choking, chest tightness, shortness of breath and wheezing.   Cardiovascular:  Negative for chest pain, palpitations and leg swelling.  Gastrointestinal:  Negative for abdominal distention, abdominal pain, anal bleeding, blood in stool, constipation, diarrhea, nausea, rectal pain and vomiting.  Endocrine: Negative for cold intolerance, heat intolerance, polydipsia, polyphagia and polyuria.  Genitourinary:  Negative for difficulty urinating, dysuria, flank pain, frequency, genital sores and hematuria.  Musculoskeletal:  Negative for arthralgias, back pain, gait problem, joint swelling and myalgias.  Skin:  Positive for rash. Negative for color change, pallor and wound.  Allergic/Immunologic: Negative for environmental allergies, food allergies and immunocompromised state.  Neurological:  Negative for dizziness, tremors, seizures, syncope, facial asymmetry, speech difficulty, weakness, light-headedness, numbness and headaches.  Hematological:  Negative for adenopathy. Does not bruise/bleed easily.  Psychiatric/Behavioral:  Negative for agitation, behavioral problems, confusion, hallucinations, self-injury, sleep disturbance and suicidal ideas. The patient is not nervous/anxious.        Objective:     BP (!) 115/53   Pulse 66   Ht 4\' 11"  (1.499 m)   Wt 127 lb 9.6 oz (57.9 kg)   LMP 05/17/2023 (Approximate)   SpO2 100%   BMI 25.77 kg/m    Physical Exam  Vitals and nursing note reviewed. Exam conducted with a chaperone present.  Constitutional:      General: She is not in acute distress.    Appearance: Normal appearance. She is not ill-appearing, toxic-appearing or diaphoretic.  HENT:     Head: Normocephalic and atraumatic. Hair is normal.     Right Ear: Tympanic membrane, ear canal and external ear normal. There is no impacted cerumen.     Left Ear: Tympanic membrane, ear canal and  external ear normal. There is no impacted cerumen.     Nose: Nose normal. No congestion or rhinorrhea.     Mouth/Throat:     Mouth: Mucous membranes are moist.     Pharynx: Oropharynx is clear. No oropharyngeal exudate or posterior oropharyngeal erythema.  Eyes:     General: No scleral icterus.       Right eye: No discharge.        Left eye: No discharge.     Extraocular Movements: Extraocular movements intact.     Conjunctiva/sclera: Conjunctivae normal.  Neck:     Vascular: No carotid bruit.  Cardiovascular:     Rate and Rhythm: Normal rate and regular rhythm.     Pulses: Normal pulses.     Heart sounds: Normal heart sounds. No murmur heard.    No friction rub. No gallop.  Pulmonary:     Effort: Pulmonary effort is normal. No respiratory distress.     Breath sounds: Normal breath sounds. No stridor. No wheezing, rhonchi or rales.  Chest:     Chest wall: No tenderness.  Abdominal:     General: Bowel sounds are normal. There is no distension.     Palpations: Abdomen is soft. There is no mass.     Tenderness: There is no abdominal tenderness. There is no right CVA tenderness, left CVA tenderness, guarding or rebound.     Hernia: No hernia is present.  Musculoskeletal:        General: No swelling, tenderness, deformity or signs of injury.     Cervical back: Normal range of motion and neck supple. No rigidity or tenderness.     Right lower leg: No edema.     Left lower leg: No edema.  Lymphadenopathy:     Cervical: No cervical adenopathy.  Skin:    General: Skin is warm and dry.     Capillary Refill: Capillary refill takes less than 2 seconds.     Coloration: Skin is not jaundiced or pale.     Findings: Rash present. No bruising, erythema or lesion.     Comments: Rashes noted on bilateral upper arm.  A rash noted beside her nose on the right side of the face  No redness, swelling, or drainage noted.   All toes thickened and whitish in color.   Neurological:     Mental Status:  She is alert and oriented to person, place, and time.     Cranial Nerves: No cranial nerve deficit.     Sensory: No sensory deficit.     Motor: No weakness.     Coordination: Coordination normal.     Gait: Gait normal.     Deep Tendon Reflexes: Reflexes normal.  Psychiatric:        Mood and Affect: Mood normal.        Behavior: Behavior normal.        Thought Content: Thought content normal.        Judgment: Judgment normal.     No results found for any  visits on 05/28/23.     Assessment & Plan:    Routine Health Maintenance and Physical Exam  Immunization History  Administered Date(s) Administered   Tdap 05/28/2023    Health Maintenance  Topic Date Due   COVID-19 Vaccine (1) Never done   HIV Screening  Never done   Hepatitis C Screening  Never done   PAP SMEAR-Modifier  02/05/2020   Colonoscopy  Never done   INFLUENZA VACCINE  07/25/2023   DTaP/Tdap/Td (2 - Td or Tdap) 05/27/2033   HPV VACCINES  Aged Out    Discussed health benefits of physical activity, and encouraged her to engage in regular exercise appropriate for her age and condition.  Problem List Items Addressed This Visit       Musculoskeletal and Integument   Seborrheic dermatitis of scalp     4. Seborrheic dermatitis of scalp  - Ciclopirox 1 % shampoo; Apply 5 mL to wet hair twice weekly for up to 4 weeks  Dispense: 120 mL; Refill: 0        Relevant Medications   Ciclopirox 1 % shampoo (Start on 05/30/2023)   Fungal infection of nail     - ciclopirox (PENLAC) 8 % solution; Apply topically at bedtime. Apply over nail and surrounding skin. Apply daily over previous coat. After seven (7) days, may remove with alcohol and continue cycle.  Dispense: 6.6 mL; Refill: 0      Relevant Medications   ciclopirox (PENLAC) 8 % solution   Ciclopirox 1 % shampoo (Start on 05/30/2023)   Rash and other nonspecific skin eruption    Rashes on bilateral arms looks like heat rashes.  Patient encouraged to avoid  exposure to excess heat.  Dress in loose lightweight clothing Patient encouraged to use hydrating lotions like CeraVe or Cetaphil Avoid scratching the rashes to prevent infection Follow-up with dermatology      Relevant Orders   Ambulatory referral to Dermatology     Other   Need for diphtheria-tetanus-pertussis (Tdap) vaccine    Patient educated on CDC recommendation for the Tdap  vaccine. Verbal consent was obtained from the patient, vaccine administered by nurse, no sign of adverse reactions noted at this time. Patient education on arm soreness and use of tylenol or  for this patient  was discussed. Patient educated on the signs and symptoms of adverse effect and advise to contact the office if they occur.       Relevant Orders   Tdap vaccine greater than or equal to 7yo IM (Completed)   Annual physical exam - Primary    Annual exam as documented.  Counseling done include healthy lifestyle involving committing to 150 minutes of exercise per week, heart healthy diet, and attaining healthy weight. The importance of adequate sleep also discussed.  Regular use of seat belt and home safety were also discussed . Immunization and cancer screening  needs are specifically addressed at this visit.    She had some labs done at the OB/GYN office.  Records requested today.  Patient will let us know what labs with done to assist in determining what labs she needs.      Return in about 4 months (around 09/27/2023) for Fungal nail infection.     Donell Beers, FNP

## 2023-05-28 NOTE — Assessment & Plan Note (Signed)
Patient educated on CDC recommendation for the Tdap  vaccine. Verbal consent was obtained from the patient, vaccine administered by nurse, no sign of adverse reactions noted at this time. Patient education on arm soreness and use of tylenol or  for this patient  was discussed. Patient educated on the signs and symptoms of adverse effect and advise to contact the office if they occur.

## 2023-05-28 NOTE — Assessment & Plan Note (Addendum)
Rashes on bilateral arms looks like heat rashes.  Patient encouraged to avoid exposure to excess heat.  Dress in loose lightweight clothing Patient encouraged to use hydrating lotions like CeraVe or Cetaphil Avoid scratching the rashes to prevent infection Follow-up with dermatology

## 2023-05-28 NOTE — Assessment & Plan Note (Signed)
Annual exam as documented.  Counseling done include healthy lifestyle involving committing to 150 minutes of exercise per week, heart healthy diet, and attaining healthy weight. The importance of adequate sleep also discussed.  Regular use of seat belt and home safety were also discussed . Immunization and cancer screening  needs are specifically addressed at this visit.    She had some labs done at the OB/GYN office.  Records requested today.  Patient will let us know what labs with done to assist in determining what labs she needs.

## 2023-05-28 NOTE — Patient Instructions (Addendum)
Fungal infection of nail  - ciclopirox (PENLAC) 8 % solution;Apply to affected fingernail(s) and/or toenail(s) and adjacent skin once daily in combination with weekly nail trimming and periodic nail debridement. Remove with alcohol every 7 days; maximum duration: 48 weeks .    Seborrheic dermatitis of scalp Shampoo 1%: Apply ~5 mL to wet hair; lather, and leave on hair and scalp for ~3 minutes; rinse. May use up to 10 mL for longer hair. Repeat twice weekly for 4 weeks;     It is important that you exercise regularly at least 30 minutes 5 times a week as tolerated  Think about what you will eat, plan ahead. Choose " clean, green, fresh or frozen" over canned, processed or packaged foods which are more sugary, salty and fatty. 70 to 75% of food eaten should be vegetables and fruit. Three meals at set times with snacks allowed between meals, but they must be fruit or vegetables. Aim to eat over a 12 hour period , example 7 am to 7 pm, and STOP after  your last meal of the day. Drink water,generally about 64 ounces per day, no other drink is as healthy. Fruit juice is best enjoyed in a healthy way, by EATING the fruit.  Thanks for choosing [facility] we consider it a privelige to serve you.

## 2023-05-28 NOTE — Assessment & Plan Note (Signed)
  4. Seborrheic dermatitis of scalp  - Ciclopirox 1 % shampoo; Apply 5 mL to wet hair twice weekly for up to 4 weeks  Dispense: 120 mL; Refill: 0

## 2023-06-06 ENCOUNTER — Encounter: Payer: Self-pay | Admitting: Internal Medicine

## 2023-07-02 ENCOUNTER — Encounter: Payer: Self-pay | Admitting: Internal Medicine

## 2023-07-02 ENCOUNTER — Ambulatory Visit (AMBULATORY_SURGERY_CENTER): Payer: Managed Care, Other (non HMO)

## 2023-07-02 ENCOUNTER — Telehealth: Payer: Self-pay

## 2023-07-02 VITALS — Ht 59.0 in | Wt 125.0 lb

## 2023-07-02 DIAGNOSIS — Z1211 Encounter for screening for malignant neoplasm of colon: Secondary | ICD-10-CM

## 2023-07-02 MED ORDER — NA SULFATE-K SULFATE-MG SULF 17.5-3.13-1.6 GM/177ML PO SOLN: 1.0000 | Freq: Once | ORAL | 0 refills | Status: AC

## 2023-07-02 NOTE — Telephone Encounter (Signed)
PV complete 

## 2023-07-02 NOTE — Progress Notes (Signed)
No egg or soy allergy known to patient  No issues known to pt with past sedation with any surgeries or procedures Patient denies ever being told they had issues or difficulty with intubation  No FH of Malignant Hyperthermia Pt is not on diet pills Pt is not on  home 02  Pt is not on blood thinners  Pt denies issues with constipation  No A fib or A flutter Have any cardiac testing pending--no  LOA: independent  Prep: suprep  Patient's chart reviewed by Cathlyn Parsons CNRA prior to previsit and patient appropriate for the [facility].  Previsit completed and red dot placed by patient's name on their procedure day (on provider's schedule).     PV competed with patient. Prep instructions sent to home address. Pt advised if she has not received instructions by mid next week she will need to come to the office to get her instructions or set up Mychart. Goodrx coupon for [facility] provided to use for price reduction if needed.

## 2023-07-17 ENCOUNTER — Telehealth: Payer: Self-pay | Admitting: Internal Medicine

## 2023-07-17 ENCOUNTER — Encounter: Payer: Managed Care, Other (non HMO) | Admitting: Internal Medicine

## 2023-07-17 NOTE — Telephone Encounter (Signed)
Hi Dr Leonides Schanz,    I called this patient regarding her appointment, she did not respond neither came to her appointment.I will no show her for appointment.  Thank you

## 2023-09-27 ENCOUNTER — Ambulatory Visit: Payer: Self-pay | Admitting: Nurse Practitioner

## 2024-03-25 ENCOUNTER — Ambulatory Visit: Payer: Managed Care, Other (non HMO) | Admitting: Dermatology

## 2024-08-05 ENCOUNTER — Other Ambulatory Visit: Payer: Self-pay | Admitting: Obstetrics and Gynecology

## 2024-08-05 DIAGNOSIS — N644 Mastodynia: Secondary | ICD-10-CM

## 2024-09-30 ENCOUNTER — Encounter: Payer: Self-pay | Admitting: Obstetrics and Gynecology
